# Patient Record
Sex: Female | Born: 1990 | Race: White | Hispanic: No | Marital: Single | Smoking: Never smoker
Health system: Southern US, Community
[De-identification: ages and names within clinical notes are randomized; demographics above are authoritative.]

## PROBLEM LIST (undated history)

## (undated) ENCOUNTER — Encounter: Attending: Internal Medicine | Primary: Internal Medicine

## (undated) ENCOUNTER — Ambulatory Visit

## (undated) ENCOUNTER — Encounter

## (undated) ENCOUNTER — Telehealth

## (undated) ENCOUNTER — Ambulatory Visit: Attending: Pharmacist | Primary: Pharmacist

## (undated) ENCOUNTER — Ambulatory Visit: Payer: PRIVATE HEALTH INSURANCE

## (undated) ENCOUNTER — Encounter: Payer: PRIVATE HEALTH INSURANCE | Attending: Internal Medicine | Primary: Internal Medicine

## (undated) ENCOUNTER — Encounter: Payer: PRIVATE HEALTH INSURANCE | Attending: Surgery | Primary: Surgery

## (undated) ENCOUNTER — Encounter: Attending: Obstetrics & Gynecology | Primary: Obstetrics & Gynecology

## (undated) ENCOUNTER — Telehealth: Attending: Internal Medicine | Primary: Internal Medicine

## (undated) ENCOUNTER — Ambulatory Visit: Payer: BLUE CROSS/BLUE SHIELD

## (undated) ENCOUNTER — Telehealth
Attending: Student in an Organized Health Care Education/Training Program | Primary: Student in an Organized Health Care Education/Training Program

## (undated) ENCOUNTER — Encounter
Attending: Student in an Organized Health Care Education/Training Program | Primary: Student in an Organized Health Care Education/Training Program

## (undated) DIAGNOSIS — K5090 Crohn's disease, unspecified, without complications: Secondary | ICD-10-CM

## (undated) DIAGNOSIS — J029 Acute pharyngitis, unspecified: Principal | ICD-10-CM

## (undated) DIAGNOSIS — B2790 Infectious mononucleosis, unspecified without complication: Secondary | ICD-10-CM

## (undated) HISTORY — DX: Crohn's disease, unspecified, without complications: K50.90

## (undated) HISTORY — DX: Acute pharyngitis, unspecified: J02.9

## (undated) HISTORY — DX: Infectious mononucleosis, unspecified without complication: B27.90

---
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
## 2012-02-19 ENCOUNTER — Encounter: Payer: Self-pay | Admitting: Internal Medicine

## 2012-02-19 ENCOUNTER — Ambulatory Visit (INDEPENDENT_AMBULATORY_CARE_PROVIDER_SITE_OTHER): Payer: BC Managed Care – PPO | Admitting: Internal Medicine

## 2012-02-19 VITALS — BP 90/62 | HR 123 | Temp 99.9°F | Ht 63.0 in | Wt 114.1 lb

## 2012-02-19 DIAGNOSIS — L659 Nonscarring hair loss, unspecified: Secondary | ICD-10-CM | POA: Insufficient documentation

## 2012-02-19 DIAGNOSIS — J3501 Chronic tonsillitis: Secondary | ICD-10-CM

## 2012-02-19 DIAGNOSIS — J029 Acute pharyngitis, unspecified: Secondary | ICD-10-CM | POA: Insufficient documentation

## 2012-02-19 DIAGNOSIS — B2790 Infectious mononucleosis, unspecified without complication: Secondary | ICD-10-CM

## 2012-02-19 DIAGNOSIS — K5090 Crohn's disease, unspecified, without complications: Secondary | ICD-10-CM

## 2012-02-19 HISTORY — DX: Infectious mononucleosis, unspecified without complication: B27.90

## 2012-02-19 HISTORY — DX: Acute pharyngitis, unspecified: J02.9

## 2012-02-19 MED ORDER — TRAMADOL HCL 50 MG PO TABS: 50.0000 mg | ORAL_TABLET | Freq: Four times a day (QID) | ORAL | Status: AC | PRN

## 2012-02-19 MED ORDER — NAPROXEN 500 MG PO TABS: 500.0000 mg | ORAL_TABLET | Freq: Two times a day (BID) | ORAL | Status: AC

## 2012-02-19 MED ORDER — AZITHROMYCIN 250 MG PO TABS: ORAL_TABLET | ORAL | Status: AC

## 2012-02-19 NOTE — Patient Instructions (Addendum)
Take all new medications as prescribed Continue all other medications as before You will be contacted regarding the referral for: [facility]

## 2012-02-20 ENCOUNTER — Encounter: Payer: Self-pay | Admitting: Internal Medicine

## 2012-02-20 NOTE — Assessment & Plan Note (Signed)
Mild to mod, for antibx course,  to f/u any worsening symptoms or concerns 

## 2012-02-20 NOTE — Progress Notes (Signed)
  Subjective:    Patient ID: Angela Little, female    DOB: 26-Jun-1991, 21 y.o.   MRN: [id number]  HPI  Here as new pt, mother present and helps with hx, pt tearful, c/o worsening pain again to the pharynx, bilat upper neck, and ears, pt and mother quite frustrated over apparently 4th episode infectious symtpoms.  Pt has hx of mono in past, none recent.  Has seen UC in Nov 2012 with antibx, then 2 episodes tx at [facility], and also incidentally seen her GI Dr Jacqulyn Bath at [facility] for crohn f/u thought to be doing well.  Worst part this time is a very large tonsil on the right which makes it more difficult to breath and swallow pills, new and persistent in past few months, worse today. Past Medical History  Diagnosis Date  . Crohn's disease   . Alopecia   . Mononucleosis 02/19/2012    History 2012  . Pharyngitis 02/19/2012   History reviewed. No pertinent past surgical history.  reports that she has never smoked. She does not have any smokeless tobacco history on file. She reports that she does not drink alcohol or use illicit drugs. family history includes Arthritis in her other; Cancer in her other; Hyperlipidemia in her other; and Hypertension in her other. No Known Allergies No current outpatient prescriptions on file prior to visit.   Review of Systems Review of Systems  Constitutional: Negative for diaphoresis and unexpected weight change.  HENT: Negative for drooling and tinnitus.   Eyes: Negative for photophobia and visual disturbance.  Respiratory: Negative for choking and stridor.   Gastrointestinal: Negative for vomiting and blood in stool.  Genitourinary: Negative for hematuria and decreased urine volume.       Objective:   Physical Exam Physical Exam  VS noted, mild ill Constitutional: Pt appears well-developed and well-nourished.  HENT: Head: Normocephalic.  Right Ear: External ear normal.  Left Ear: External ear normal.  Bilat tm's mild erythema.  Sinus nontender.  Pharynx  severe erythema, right tonsil 2-3+ enlarged with crypts, no drainage Eyes: Conjunctivae and EOM are normal. Pupils are equal, round, and reactive to light.  Neck: Normal range of motion. Neck supple. but with marked bilat submandib LA, tender, nonfluctuant Cardiovascular: Normal rate and regular rhythm.   Pulmonary/Chest: Effort normal and breath sounds normal.  Abd:  Soft, NT, non-distended, + BS Neurological: Pt is alert. No cranial nerve deficit.  Skin: Skin is warm. No erythema.  Psychiatric: Pt behavior is normal. Thought content normal. 1+ tearful    Assessment & Plan:

## 2012-02-20 NOTE — Assessment & Plan Note (Signed)
With large chronic appaering right tonsil, worse today, with some difficulty with sob/swallowing but not acute obstructing today, and not abscessed - for antix as above, but will need ENT, and d/w mother and pt  - prefer [facility] ENT as pt plans to be there for the next 2 mo to finish the semester

## 2014-10-19 NOTE — Unmapped (Signed)
1430 Pt in for infusion. Pt's first time in [facility] for first time of Entyvio. Pt call light with in reach. Pt teaching regarding S\S of adverse reaction and how to use call light to summon staff if experiencing any S\S of adverse reaction while infusing. Pt verbalizing understanding.   1440 Pre meds given.   1500 Entyvio 300mg  in 266ml@560ml /hr  1515 Entyvio infusing. Pt C\O slight discomfort at IV site. Good blood return no infiltration noted. Entyvio slowed to 472ml/hr.   1536 Entyvio infusion complete. Pt teaching regarding S\S of adverse reaction and who to call if experiencing any S\S of adverse reaction upon DC from [facility]. Pt verbalizing understanding. Written instructions given to pt.   1555 VSS no S\S of adverse reaction.

## 2017-07-20 ENCOUNTER — Ambulatory Visit
Admission: RE | Admit: 2017-07-20 | Discharge: 2017-07-20 | Disposition: A | Discharge status: Home / Self Care | Payer: Commercial Managed Care - PPO

## 2017-07-20 DIAGNOSIS — K8301 Primary sclerosing cholangitis: Secondary | ICD-10-CM

## 2017-07-20 DIAGNOSIS — R945 Abnormal results of liver function studies: Secondary | ICD-10-CM

## 2017-07-20 DIAGNOSIS — K5010 Crohn's disease of large intestine without complications: Principal | ICD-10-CM

## 2017-07-22 MED ORDER — TOFACITINIB 10 MG TABLET
ORAL_TABLET | Freq: Two times a day (BID) | ORAL | 0 refills | 0 days | Status: CP
Start: 2017-07-22 — End: 2017-07-30

## 2017-07-30 MED ORDER — TOFACITINIB 10 MG TABLET
ORAL_TABLET | Freq: Two times a day (BID) | ORAL | 0 refills | 0 days | Status: CP
Start: 2017-07-30 — End: 2017-08-30

## 2017-08-14 ENCOUNTER — Ambulatory Visit
Admission: RE | Admit: 2017-08-14 | Discharge: 2017-08-14 | Disposition: A | Discharge status: Home / Self Care | Payer: Commercial Managed Care - PPO

## 2017-08-14 DIAGNOSIS — K8301 Primary sclerosing cholangitis: Principal | ICD-10-CM

## 2017-08-14 DIAGNOSIS — R945 Abnormal results of liver function studies: Secondary | ICD-10-CM

## 2017-08-30 MED ORDER — XELJANZ 10 MG TABLET
ORAL_TABLET | 0 refills | 0 days | Status: CP
Start: 2017-08-30 — End: 2017-09-26

## 2017-09-27 MED ORDER — XELJANZ 10 MG TABLET
ORAL_TABLET | 0 refills | 0 days | Status: CP
Start: 2017-09-27 — End: 2017-10-25

## 2017-10-25 MED ORDER — XELJANZ 10 MG TABLET
ORAL_TABLET | 0 refills | 0 days | Status: CP
Start: 2017-10-25 — End: 2017-11-24

## 2017-10-26 ENCOUNTER — Ambulatory Visit
Admission: RE | Admit: 2017-10-26 | Discharge: 2017-10-26 | Disposition: A | Discharge status: Home / Self Care | Payer: Commercial Managed Care - PPO | Admitting: Internal Medicine

## 2017-10-26 DIAGNOSIS — K5010 Crohn's disease of large intestine without complications: Principal | ICD-10-CM

## 2017-11-10 MED ORDER — ERGOCALCIFEROL (VITAMIN D2) 1,250 MCG (50,000 UNIT) CAPSULE
ORAL_CAPSULE | ORAL | 0 refills | 0.00000 days | Status: CP
Start: 2017-11-10 — End: 2018-01-11

## 2017-11-24 MED ORDER — XELJANZ 10 MG TABLET
ORAL_TABLET | 0 refills | 0 days | Status: CP
Start: 2017-11-24 — End: 2017-12-06

## 2017-12-06 MED ORDER — TOFACITINIB 5 MG TABLET
ORAL_TABLET | Freq: Two times a day (BID) | ORAL | 3 refills | 0 days | Status: CP
Start: 2017-12-06 — End: 2018-01-11

## 2018-01-11 ENCOUNTER — Encounter
Admit: 2018-01-11 | Discharge: 2018-01-12 | Payer: PRIVATE HEALTH INSURANCE | Attending: Internal Medicine | Primary: Internal Medicine

## 2018-01-11 DIAGNOSIS — K5100 Ulcerative (chronic) pancolitis without complications: Principal | ICD-10-CM

## 2018-01-11 DIAGNOSIS — R740 Nonspecific elevation of levels of transaminase and lactic acid dehydrogenase [LDH]: Secondary | ICD-10-CM

## 2018-01-11 MED ORDER — TOFACITINIB 5 MG TABLET
ORAL_TABLET | Freq: Two times a day (BID) | ORAL | 3 refills | 0 days | Status: CP
Start: 2018-01-11 — End: 2018-01-12

## 2018-01-11 MED ORDER — ERGOCALCIFEROL (VITAMIN D2) 1,250 MCG (50,000 UNIT) CAPSULE
ORAL_CAPSULE | ORAL | 0 refills | 0.00000 days | Status: CP
Start: 2018-01-11 — End: 2018-09-13

## 2018-01-12 MED ORDER — TOFACITINIB 5 MG TABLET
ORAL_TABLET | Freq: Two times a day (BID) | ORAL | 3 refills | 0.00000 days
Start: 2018-01-12 — End: 2018-05-16

## 2018-01-17 ENCOUNTER — Ambulatory Visit
Admit: 2018-01-17 | Discharge: 2018-01-18 | Payer: PRIVATE HEALTH INSURANCE | Attending: Internal Medicine | Primary: Internal Medicine

## 2018-01-17 DIAGNOSIS — R740 Nonspecific elevation of levels of transaminase and lactic acid dehydrogenase [LDH]: Secondary | ICD-10-CM

## 2018-01-17 DIAGNOSIS — R945 Abnormal results of liver function studies: Principal | ICD-10-CM

## 2018-01-24 ENCOUNTER — Ambulatory Visit: Admit: 2018-01-24 | Discharge: 2018-01-24 | Payer: PRIVATE HEALTH INSURANCE

## 2018-01-24 DIAGNOSIS — R945 Abnormal results of liver function studies: Principal | ICD-10-CM

## 2018-01-31 ENCOUNTER — Encounter
Admit: 2018-01-31 | Discharge: 2018-02-01 | Payer: PRIVATE HEALTH INSURANCE | Attending: Obstetrics & Gynecology | Primary: Obstetrics & Gynecology

## 2018-01-31 DIAGNOSIS — Z124 Encounter for screening for malignant neoplasm of cervix: Secondary | ICD-10-CM

## 2018-01-31 DIAGNOSIS — Z309 Encounter for contraceptive management, unspecified: Principal | ICD-10-CM

## 2018-01-31 MED ORDER — MICROGESTIN FE 1/20 (28) 1 MG-20 MCG (21)/75 MG (7) TABLET
ORAL_TABLET | Freq: Every day | ORAL | 4 refills | 0.00000 days | Status: CP
Start: 2018-01-31 — End: 2019-02-27

## 2018-04-29 ENCOUNTER — Encounter: Admit: 2018-04-29 | Discharge: 2018-04-30 | Payer: PRIVATE HEALTH INSURANCE

## 2018-04-29 DIAGNOSIS — Z23 Encounter for immunization: Principal | ICD-10-CM

## 2018-05-03 MED ORDER — XELJANZ 5 MG TABLET
ORAL_TABLET | 0 refills | 0 days | Status: CP
Start: 2018-05-03 — End: 2018-05-31

## 2018-05-17 MED ORDER — TOFACITINIB 5 MG TABLET
ORAL_TABLET | Freq: Two times a day (BID) | ORAL | 3 refills | 0 days
Start: 2018-05-17 — End: 2018-09-13

## 2018-05-31 MED ORDER — XELJANZ 5 MG TABLET
ORAL_TABLET | 3 refills | 0 days | Status: CP
Start: 2018-05-31 — End: 2018-09-13

## 2018-08-01 ENCOUNTER — Encounter: Admit: 2018-08-01 | Discharge: 2018-08-02 | Payer: PRIVATE HEALTH INSURANCE

## 2018-08-01 DIAGNOSIS — Z23 Encounter for immunization: Principal | ICD-10-CM

## 2018-09-13 ENCOUNTER — Encounter
Admit: 2018-09-13 | Discharge: 2018-09-14 | Payer: PRIVATE HEALTH INSURANCE | Attending: Internal Medicine | Primary: Internal Medicine

## 2018-09-13 DIAGNOSIS — K5100 Ulcerative (chronic) pancolitis without complications: Secondary | ICD-10-CM

## 2018-09-13 DIAGNOSIS — Z23 Encounter for immunization: Principal | ICD-10-CM

## 2018-09-13 MED ORDER — TOFACITINIB 5 MG TABLET
ORAL_TABLET | Freq: Two times a day (BID) | ORAL | 3 refills | 0.00000 days | Status: CP
Start: 2018-09-13 — End: 2018-11-23

## 2018-11-23 MED ORDER — TOFACITINIB 5 MG TABLET
ORAL_TABLET | Freq: Two times a day (BID) | ORAL | 3 refills | 0 days | Status: CP
Start: 2018-11-23 — End: 2019-03-03

## 2019-02-24 DIAGNOSIS — Z309 Encounter for contraceptive management, unspecified: Principal | ICD-10-CM

## 2019-02-27 DIAGNOSIS — Z309 Encounter for contraceptive management, unspecified: Principal | ICD-10-CM

## 2019-02-27 MED ORDER — MICROGESTIN FE 1/20 (28) 1 MG-20 MCG (21)/75 MG (7) TABLET
ORAL_TABLET | Freq: Every day | ORAL | 4 refills | 0 days | Status: CP
Start: 2019-02-27 — End: ?

## 2019-03-03 DIAGNOSIS — K5100 Ulcerative (chronic) pancolitis without complications: Principal | ICD-10-CM

## 2019-03-03 MED ORDER — XELJANZ 5 MG TABLET
ORAL_TABLET | Freq: Two times a day (BID) | ORAL | 3 refills | 90 days | Status: CP
Start: 2019-03-03 — End: ?
  Filled 2019-03-21: qty 60, 30d supply, fill #0

## 2019-03-16 NOTE — Unmapped (Signed)
[facility] Specialty Medication Referral: No PA Required      Medication (Brand/Generic): [name]    Final Test Claim completed with resulted information below:    Patient ABLE to fill at [facility]:  [employer] [place]  Anticipated Copay: $0  Is anticipated copay with a copay card or grant? No, there is no need for grant or copay assistance.     Does patient's insurance plan only allow a 15 day supply for the first 6 fills in the Split Fill Program? No  If yes, inform patient they can request to dis-enroll from the [facility] by calling the patient help desk at N/A.      If the copay is under the $25 defined limit, per policy there will be no further investigation of need for financial assistance at this time unless patient requests. This referral has been communicated to the provider and handed off to the [facility] team for further processing and filling of prescribed medication.   ______________________________________________________________________  Please utilize this referral for viewing purposes as it will serve as the central location for all relevant documentation and updates.

## 2019-03-17 NOTE — Unmapped (Signed)
[facility]   Patient Onboarding/Medication Counseling    Helen Calhoun is a 28 y.o. 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  I am speaking to the patient.    Verified patient's date of birth / HIPAA.    Specialty medication(s) to be sent: Inflammatory Disorders: [name]      Non-specialty medications/supplies to be sent: none       Xeljanz (tofacitinib)    Medication & Administration     Dosage: PSC - IBD (maintenance treatment): Take 5mg  by mouth twice daily    Lab tests required prior to treatment initiation:  ? Tuberculosis: Tuberculosis screening not documented in the patient's chart but medication prescriber has been notified of this missing documentation and wishes to continue treatment at this time.  ? Hepatitis B: Hepatitis B serology studies are complete and non-reactive.      Administration:  Take tablets by mouth with or without food.  For extended release tablets ??? swallow intact, do not crush, split, or chew.    Adherence/Missed dose instructions:  Take a missed dose as soon as you remember it if it's been less than 6 hours (IR tabs) or 12 hours (XR tabs) from your normal dosing time.  Never take 2 doses to try and catch up from a missed dose.    Goals of Therapy     ? Achieve remission of symptoms  ? Maintain remission of symptoms  ? Minimize long-term systemic glucocorticoid use  ? Prevent need for surgical procedures  ? Maintenance of effective psychosocial functioning      Side Effects & Monitoring Parameters     ? Signs of a common cold ??? minor sore throat, runny or stuffy nose, etc.  ? Headache  ? Diarrhea    The following side effects should be reported to the provider:  ? Signs of a hypersensitivity reaction ??? rash; hives; itching; red, swollen, blistered, or peeling skin; wheezing; tightness in the chest or throat; difficulty breathing, swallowing, or talking; swelling of the mouth, face, lips, tongue, or throat; etc.  ? Reduced immune function ??? report signs of infection such as fever; chills; body aches; very bad sore throat; ear or sinus pain; cough; more sputum or change in color of sputum; pain with passing urine; wound that will not heal, etc.  Also at a slightly higher risk of some malignancies (mainly skin and blood cancers) due to this reduced immune function.  o In the case of signs of infection ??? the patient should hold the next dose of Xeljanz?? and call your primary care provider to ensure adequate medical care.  Treatment may be resumed when infection is treated and patient is asymptomatic.  ? Changes in skin ??? a new growth or lump that forms; changes in shape, size, or color of a previous mole or marking  ? Swelling or pain in your stomach that is very bad, gets worse, or does not go away  ? Any signs of GI bleed ??? blood in vomit or stool  ? Heartbeat that does not feel normal  ? Shortness of breath      Contraindications, Warnings, & Precautions     ? Dosage adjustment may be needed if used concomitantly with other CYP3A4 substrates  ? Have your bloodwork checked as you have been told by your prescriber  ? Talk with your doctor if you are pregnant, planning to become pregnant, or breastfeeding  ? Discuss the possible need for holding your  dose(s) of Humira?? when a planned procedure is scheduled with the prescriber as it may delay healing/recovery timeline   ?     Drug/Food Interactions     ? Medication list reviewed in Epic. The patient was instructed to inform the care team before taking any new medications or supplements. No drug interactions identified.   ? This medication can interact with grapefruit, star fruit, and [place] oranges  ? Talk with you prescriber or pharmacist before receiving any live vaccinations while taking this medication and after you stop taking it      Storage, Handling Precautions, & Disposal     ? Store in original container  ? Store at room temperature  ? Avoid moisture    ** Due to having been on this medication for ~2 years, the patient declined the following portions of the counseling outlined above: administration; adherence / missed dose instructions; goals of therapy; side effects and monitoring parameters; contraindications, warnings, and precautions; drug/food interactions; storage, handling precautions, and disposal **    Current Medications (including OTC/herbals), Comorbidities and Allergies     Current Outpatient Medications   Medication Sig Dispense Refill   ??? loratadine (CLARITIN) 10 mg tablet Take 10 mg by mouth daily as needed.      ??? MICROGESTIN FE 1/20, 28, 1 mg-20 mcg (21)/75 mg (7) per tablet Take 1 tablet by mouth daily. 84 tablet 4   ??? tofacitinib (XELJANZ) 5 mg Tab tablet Take 1 tablet (5 mg total) by mouth Two (2) times a day. 180 tablet 3     No current facility-administered medications for this visit.        No Known Allergies    Patient Active Problem List   Diagnosis   ??? Crohn's colitis (CMS-HCC)   ??? Alopecia areata   ??? Chronic rhinitis   ??? Acute tonsillitis   ??? Encounter for gynecological examination (general) (routine) without abnormal findings   ??? Atypical squamous cells of undetermined significance on cytologic smear of cervix (ASC-US)       Reviewed and up to date in Epic.    Appropriateness of Therapy     Is medication and dose appropriate based on diagnosis? Yes    Baseline Quality of Life Assessment      How many days over the past month did your IBD keep you from your normal activities? 0    Financial Information     Medication Assistance provided: None Required    Anticipated copay of $0.00 reviewed with patient. Verified delivery address.    Delivery Information     Scheduled delivery date: 03/22/2019    Expected start date: n/a -  Pt is currently taking this medication    Medication will be delivered via Next Day Courier to the prescription address in [facility].  This shipment will not require a signature.      Explained the services we provide at [facility] and that each month we would call to set up refills.  Stressed importance of returning phone calls so that we could ensure they receive their medications in time each month.  Informed patient that we should be setting up refills 7-10 days prior to when they will run out of medication.  A pharmacist will reach out to perform a clinical assessment periodically.  Informed patient that a welcome packet and a drug information handout will be sent.      Patient verbalized understanding of the above information as well as how to contact  the pharmacy at [phone] option 4 with any questions/concerns.  The pharmacy is open Monday through Friday 8:30am-4:30pm.  A pharmacist is available 24/7 via pager to answer any clinical questions they may have.    Patient Specific Needs     ? Does the patient have any physical, cognitive, or cultural barriers? No    ? Patient prefers to have medications discussed with  Patient     ? Is the patient able to read and understand education materials at a high school level or above? Yes    ? Patient's primary language is  English     ? Is the patient high risk? No     ? Does the patient require a Care Management Plan? No     ? Does the patient require physician intervention or other additional services (i.e. nutrition, smoking cessation, social work)? No      Karene Fry Jakory Matsuo  [facility] [employer] [facility] Specialty Pharmacist

## 2019-03-21 MED FILL — XELJANZ 5 MG TABLET: 30 days supply | Qty: 60 | Fill #0 | Status: AC

## 2019-04-13 NOTE — Unmapped (Signed)
[facility] Clinical Assessment & Refill Coordination Note    Helen Calhoun, DOB: 12/08/1990  Phone: [phone] (home)     All above HIPAA information was verified with patient.     Specialty Medication(s):   Inflammatory Disorders: [name]     Current Outpatient Medications   Medication Sig Dispense Refill   ??? loratadine (CLARITIN) 10 mg tablet Take 10 mg by mouth daily as needed.      ??? MICROGESTIN FE 1/20, 28, 1 mg-20 mcg (21)/75 mg (7) per tablet Take 1 tablet by mouth daily. 84 tablet 4   ??? tofacitinib (XELJANZ) 5 mg Tab tablet Take 1 tablet (5 mg total) by mouth Two (2) times a day. 180 tablet 3     No current facility-administered medications for this visit.         Changes to medications: Helen Calhoun reports no changes at this time.    No Known Allergies    Changes to allergies: No    SPECIALTY MEDICATION ADHERENCE     Xeljanz 5 mg: 8 days of medicine on hand       Medication Adherence    Patient reported X missed doses in the last month:  0  Specialty Medication:  [name] 5mg           Specialty medication(s) dose(s) confirmed: Regimen is correct and unchanged.     Are there any concerns with adherence? No    Adherence counseling provided? Not needed    CLINICAL MANAGEMENT AND INTERVENTION      Clinical Benefit Assessment:    Do you feel the medicine is effective or helping your condition? Yes    Clinical Benefit counseling provided? Not needed    Adverse Effects Assessment:    Are you experiencing any side effects? No    Are you experiencing difficulty administering your medicine? No    Quality of Life Assessment:    How many days over the past month did your IBD keep you from your normal activities? For example, brushing your teeth or getting up in the morning. Patient declined to answer    Have you discussed this with your provider? Not needed    Therapy Appropriateness:    Is therapy appropriate? Yes, therapy is appropriate and should be continued DISEASE/MEDICATION-SPECIFIC INFORMATION      N/A    PATIENT SPECIFIC NEEDS     ? Does the patient have any physical, cognitive, or cultural barriers? No    ? Is the patient high risk? No     ? Does the patient require a Care Management Plan? No     ? Does the patient require physician intervention or other additional services (i.e. nutrition, smoking cessation, social work)? No      SHIPPING     Specialty Medication(s) to be Shipped:   Inflammatory Disorders: [name] 5mg     Other medication(s) to be shipped: none       Changes to insurance: No    Delivery Scheduled: Yes, Expected medication delivery date: 04/18/2019.     Medication will be delivered via Next Day Courier to the confirmed prescription address in [facility].    The patient will receive a drug information handout for each medication shipped and additional FDA Medication Guides as required.  Verified that patient has previously received a Conservation officer, historic buildings.    Karene Fry Kevon Tench   [facility] [employer] [facility] Specialty Pharmacist

## 2019-04-17 MED FILL — XELJANZ 5 MG TABLET: ORAL | 30 days supply | Qty: 60 | Fill #1

## 2019-04-17 MED FILL — XELJANZ 5 MG TABLET: 30 days supply | Qty: 60 | Fill #1 | Status: AC

## 2019-05-12 NOTE — Unmapped (Signed)
[facility] Refill Coordination Note    Specialty Medication(s) to be Shipped:   Inflammatory Disorders: Helen Calhoun    Other medication(s) to be shipped: N/A     Helen Calhoun, DOB: 1991-02-10  Phone: There are no phone numbers on file.      All above HIPAA information was verified with patient.     Completed refill call assessment today to schedule patient's medication shipment from the [facility] [phone]).       Specialty medication(s) and dose(s) confirmed: Regimen is correct and unchanged.   Changes to medications: [name] reports no changes at this time.  Changes to insurance: No  Questions for the pharmacist: No    Confirmed patient received Welcome Packet with first shipment. The patient will receive a drug information handout for each medication shipped and additional FDA Medication Guides as required.       DISEASE/MEDICATION-SPECIFIC INFORMATION        N/A    SPECIALTY MEDICATION ADHERENCE     Medication Adherence    Patient reported X missed doses in the last month:  0  Specialty Medication:  Helen Calhoun 5mg         XELJANZ 5 mg: 14 days of medicine on hand       SHIPPING     Shipping address confirmed in Epic.     Delivery Scheduled: Yes, Expected medication delivery date: 05/23/19.     Medication will be delivered via Next Day Courier to the prescription address in Epic [facility].    Helen Calhoun [facility] Specialty Pharmacist

## 2019-05-12 NOTE — Unmapped (Signed)
The [facility] has made a third and final attempt to reach this patient to refill the following medication:xeljanz.      We have left voicemails on the following phone numbers: [phone].    Dates contacted: 05/28,06/01,06/05  Last scheduled delivery: 05/11    The patient may be at risk of non-compliance with this medication. The patient should call the [facility] at [phone] (option 4) to refill medication.    Antonietta Barcelona   [facility] Specialty Technician

## 2019-05-22 MED FILL — XELJANZ 5 MG TABLET: ORAL | 30 days supply | Qty: 60 | Fill #2

## 2019-05-22 MED FILL — XELJANZ 5 MG TABLET: 30 days supply | Qty: 60 | Fill #2 | Status: AC

## 2019-06-13 NOTE — Unmapped (Signed)
[facility] Refill Coordination Note    Specialty Medication(s) to be Shipped:   General Specialty: [name] 5mg     Other medication(s) to be shipped:       Helen Calhoun, DOB: 1991/02/10  Phone: There are no phone numbers on file.      All above HIPAA information was verified with patient.     Completed refill call assessment today to schedule patient's medication shipment from the [facility] [phone]).       Specialty medication(s) and dose(s) confirmed: Regimen is correct and unchanged.   Changes to medications: Helen Calhoun reports no changes at this time.  Changes to insurance: No  Questions for the pharmacist: No    Confirmed patient received Welcome Packet with first shipment. The patient will receive a drug information handout for each medication shipped and additional FDA Medication Guides as required.       DISEASE/MEDICATION-SPECIFIC INFORMATION        N/A    SPECIALTY MEDICATION ADHERENCE     Medication Adherence    Patient reported X missed doses in the last month:  1  Specialty Medication:  [name] 5mg   Patient is on additional specialty medications:  No                Xeljanz 5 mg: 14 days of medicine on hand       SHIPPING     Shipping address confirmed in Epic.     Delivery Scheduled: Yes, Expected medication delivery date: 07/16.     Medication will be delivered via Next Day Courier to the prescription address in Epic W[facility].    Antonietta Barcelona   [facility] Specialty Technician

## 2019-06-21 MED FILL — XELJANZ 5 MG TABLET: 30 days supply | Qty: 60 | Fill #3 | Status: AC

## 2019-06-21 MED FILL — XELJANZ 5 MG TABLET: ORAL | 30 days supply | Qty: 60 | Fill #3

## 2019-07-11 NOTE — Unmapped (Signed)
[facility] Pharm[facility] Refill Coordination Note    Specialty Medication(s) to be Shipped:   General Specialty: [name] 5mg     Other medication(s) to be shipped:       Helen Calhoun, DOB: [birth year]  Phone: There are no phone numbers on file.      All above HIPAA information was verified with patient.     Completed refill call assessment today to schedule patient's medication shipment from the [facility] [phone]).       Specialty medication(s) and dose(s) confirmed: Regimen is correct and unchanged.   Changes to medications: Helen Calhoun reports no changes at this time.  Changes to insurance: No  Questions for the pharmacist: No    Confirmed patient received Welcome Packet with first shipment. The patient will receive a drug information handout for each medication shipped and additional FDA Medication Guides as required.       DISEASE/MEDICATION-SPECIFIC INFORMATION        N/A    SPECIALTY MEDICATION ADHERENCE     Medication Adherence    Patient reported X missed doses in the last month: 1  Specialty Medication: xeljanz 5mg   Patient is on additional specialty medications: No                Xeljanz 5 mg: 14 days of medicine on hand     SHIPPING     Shipping address confirmed in Epic.     Delivery Scheduled: Yes, Expected medication delivery date: 08/13.     Medication will be delivered via Next Day Courier to the prescription address in Epic WAM.    Helen Calhoun   [facility] Specialty Technician

## 2019-07-19 MED FILL — XELJANZ 5 MG TABLET: ORAL | 30 days supply | Qty: 60 | Fill #4

## 2019-07-19 MED FILL — XELJANZ 5 MG TABLET: 30 days supply | Qty: 60 | Fill #4 | Status: AC

## 2019-08-09 NOTE — Unmapped (Signed)
[facility] Refill Coordination Note    Specialty Medication(s) to be Shipped:   General Specialty: Helen Calhoun 5mg     Other medication(s) to be shipped:       Helen Calhoun, DOB: [birth year]  Phone: There are no phone numbers on file.      All above HIPAA information was verified with patient.     Completed refill call assessment today to schedule patient's medication shipment from the [facility] [phone]).       Specialty medication(s) and dose(s) confirmed: Regimen is correct and unchanged.   Changes to medications: Helen Calhoun reports no changes at this time.  Changes to insurance: No  Questions for the pharmacist: No    Confirmed patient received Welcome Packet with first shipment. The patient will receive a drug information handout for each medication shipped and additional FDA Medication Guides as required.       DISEASE/MEDICATION-SPECIFIC INFORMATION        N/A    SPECIALTY MEDICATION ADHERENCE     Medication Adherence    Patient reported X missed doses in the last month: 2  Specialty Medication: Helen Calhoun 5mg   Patient is on additional specialty medications: No                Xeljanz 5 mg: 14 days of medicine on hand       SHIPPING     Shipping address confirmed in Epic.     Delivery Scheduled: Yes, Expected medication delivery date: 09/10.     Medication will be delivered via Next Day Courier to the prescription address in Epic WAM.    Antonietta Barcelona   [facility] Specialty Technician

## 2019-08-16 MED FILL — XELJANZ 5 MG TABLET: ORAL | 30 days supply | Qty: 60 | Fill #5

## 2019-08-16 MED FILL — XELJANZ 5 MG TABLET: 30 days supply | Qty: 60 | Fill #5 | Status: AC

## 2019-09-05 NOTE — Unmapped (Signed)
[facility] Clinical Assessment & Refill Coordination Note    Helen Calhoun, DOB: 1991-09-30  Phone: There are no phone numbers on file.    All above HIPAA information was verified with patient.     Specialty Medication(s):   Inflammatory Disorders: [name]     Current Outpatient Medications   Medication Sig Dispense Refill   ??? loratadine (CLARITIN) 10 mg tablet Take 10 mg by mouth daily as needed.      ??? MICROGESTIN FE 1/20, 28, 1 mg-20 mcg (21)/75 mg (7) per tablet Take 1 tablet by mouth daily. 84 tablet 4   ??? tofacitinib (XELJANZ) 5 mg Tab tablet Take 1 tablet (5 mg total) by mouth Two (2) times a day. 180 tablet 3     No current facility-administered medications for this visit.         Changes to medications: [name] reports no changes at this time.    No Known Allergies    Changes to allergies: No    SPECIALTY MEDICATION ADHERENCE   Xeljanz 5 mg: 0 days of medicine on hand       Medication Adherence    Specialty Medication: [name] 5mg    Patient is on additional specialty medications: No  Informant: patient          Specialty medication(s) dose(s) confirmed: Regimen is correct and unchanged.     Are there any concerns with adherence? No    Adherence counseling provided? Not needed    CLINICAL MANAGEMENT AND INTERVENTION      Clinical Benefit Assessment:    Do you feel the medicine is effective or helping your condition? Yes    Clinical Benefit counseling provided? Not needed    Adverse Effects Assessment:    Are you experiencing any side effects? No    Are you experiencing difficulty administering your medicine? No    Quality of Life Assessment:    How many days over the past month did your Ulcerative pancolitis  keep you from your normal activities? For example, brushing your teeth or getting up in the morning. 0    Have you discussed this with your provider? Not needed    Therapy Appropriateness:    Is therapy appropriate? Yes, therapy is appropriate and should be continued    DISEASE/MEDICATION-SPECIFIC INFORMATION      N/A    PATIENT SPECIFIC NEEDS     ? Does the patient have any physical, cognitive, or cultural barriers? No    ? Is the patient high risk? No     ? Does the patient require a Care Management Plan? No     ? Does the patient require physician intervention or other additional services (i.e. nutrition, smoking cessation, social work)? No      SHIPPING     Specialty Medication(s) to be Shipped:   Inflammatory Disorders: [name]    Other medication(s) to be shipped: n/a     Changes to insurance: No    Delivery Scheduled: Yes, Expected medication delivery date: 10/7.     Medication will be delivered via UPS to the confirmed home address in [place].    The patient will receive a drug information handout for each medication shipped and additional FDA Medication Guides as required.  Verified that patient has previously received a Conservation officer, historic buildings.    All of the patient's questions and concerns have been addressed.    [name]   [facility] Specialty Pharmacist

## 2019-09-12 MED FILL — XELJANZ 5 MG TABLET: ORAL | 30 days supply | Qty: 60 | Fill #6

## 2019-09-12 MED FILL — XELJANZ 5 MG TABLET: 30 days supply | Qty: 60 | Fill #6 | Status: AC

## 2019-10-02 NOTE — Unmapped (Signed)
[facility] Special[facility] Pharm[facility] Refill Coordination Note    Specialty Medication(s) to be Shipped:   General Specialty: Jorje Guild medication(s) to be shipped:       Helen Calhoun, DOB: Feb 11, 1991  Phone: There are no phone numbers on file.      All above HIPAA information was verified with patient.     Completed refill call assessment today to schedule patient's medication shipment from the [facility] [phone]).       Specialty medication(s) and dose(s) confirmed: Regimen is correct and unchanged.   Changes to medications: Helen Calhoun reports no changes at this time.  Changes to insurance: No  Questions for the pharmacist: No    Confirmed patient received Welcome Packet with first shipment. The patient will receive a drug information handout for each medication shipped and additional FDA Medication Guides as required.       DISEASE/MEDICATION-SPECIFIC INFORMATION        N/A    SPECIALTY MEDICATION ADHERENCE     Medication Adherence    Patient reported X missed doses in the last month: 2  Specialty Medication: xeljanz 5mg   Patient is on additional specialty medications: No  Informant: patient                [name] 5mg  : 10 days of medicine on hand         SHIPPING     Shipping address confirmed in Epic.     Delivery Scheduled: Yes, Expected medication delivery date: 11/03.     Medication will be delivered via UPS to the home address in [place].    Antonietta Barcelona   [facility] Specialty Technician

## 2019-10-09 MED FILL — XELJANZ 5 MG TABLET: 30 days supply | Qty: 60 | Fill #7 | Status: AC

## 2019-10-09 MED FILL — XELJANZ 5 MG TABLET: ORAL | 30 days supply | Qty: 60 | Fill #7

## 2019-11-01 NOTE — Unmapped (Signed)
The [facility] has made a third and final attempt to reach this patient to refill the following medication:Xeljanz.      We have left voicemails on the following phone numbers: [phone].    Dates contacted: 11/19,11/23,11/25  Last scheduled delivery: 11/02    The patient may be at risk of non-compliance with this medication. The patient should call the [facility] at [phone] (option 4) to refill medication.    Antonietta Barcelona   [facility] Specialty Technician

## 2019-11-21 NOTE — Unmapped (Signed)
[facility] Pharm[facility] Refill Coordination Note    Specialty Medication(s) to be Shipped:   General Specialty: [name] 5    Other medication(s) to be shipped:       Helen Calhoun, DOB: [birth year]  Phone: There are no phone numbers on file.      All above HIPAA information was verified with patient.     Was a Nurse, learning disability used for this call? No    Completed refill call assessment today to schedule patient's medication shipment from the [facility] [phone]).       Specialty medication(s) and dose(s) confirmed: Regimen is correct and unchanged.   Changes to medications: [name] reports no changes at this time.  Changes to insurance: No  Questions for the pharmacist: No    Confirmed patient received Welcome Packet with first shipment. The patient will receive a drug information handout for each medication shipped and additional FDA Medication Guides as required.       DISEASE/MEDICATION-SPECIFIC INFORMATION        N/A    SPECIALTY MEDICATION ADHERENCE     Medication Adherence    Patient reported X missed doses in the last month: 0  Specialty Medication: xeljanz 5mg   Patient is on additional specialty medications: No  Informant: patient                xeljanz 5 mg: 14 days of medicine on hand         SHIPPING     Shipping address confirmed in Epic.     Delivery Scheduled: Yes, Expected medication delivery date: 12/17.     Medication will be delivered via UPS to the prescription address in [place].    Antonietta Barcelona   [facility] Specialty Technician

## 2019-11-22 MED FILL — XELJANZ 5 MG TABLET: 30 days supply | Qty: 60 | Fill #8 | Status: AC

## 2019-11-22 MED FILL — XELJANZ 5 MG TABLET: ORAL | 30 days supply | Qty: 60 | Fill #8

## 2019-12-12 ENCOUNTER — Other Ambulatory Visit: Payer: Self-pay

## 2019-12-12 NOTE — Unmapped (Signed)
Called to schedule Mychart request for Well-woman appointment and STD test. left message for pt to call and schedule and will msg bk via Mychart apt line

## 2019-12-13 NOTE — Unmapped (Signed)
[facility] Special[facility] Pharmacy Refill Coordination Note    Specialty Medication(s) to be Shipped:   General Specialty: [name] 5mg     Other medication(s) to be shipped:       Helen Calhoun, DOB: 08/04/1991  Phone: [phone] (home)       All above HIPAA information was verified with patient.     Was a Nurse, learning disability used for this call? No    Completed refill call assessment today to schedule patient's medication shipment from the [facility] [phone]).       Specialty medication(s) and dose(s) confirmed: Regimen is correct and unchanged.   Changes to medications: Helen Calhoun reports no changes at this time.  Changes to insurance: No  Questions for the pharmacist: No    Confirmed patient received Welcome Packet with first shipment. The patient will receive a drug information handout for each medication shipped and additional FDA Medication Guides as required.       DISEASE/MEDICATION-SPECIFIC INFORMATION        N/A    SPECIALTY MEDICATION ADHERENCE     Medication Adherence    Patient reported X missed doses in the last month: 1  Specialty Medication: xeljanz 5mg   Patient is on additional specialty medications: No  Informant: patient                xeljanz 5 mg: 14days of medicine on hand         SHIPPING     Shipping address confirmed in Epic.     Delivery Scheduled: Yes, Expected medication delivery date: 01/14.     Medication will be delivered via UPS to the prescription address in [place].    Helen Calhoun   [facility] Specialty Technician

## 2019-12-14 ENCOUNTER — Encounter: Payer: BC Managed Care – PPO | Admitting: Obstetrics and Gynecology

## 2019-12-20 MED FILL — XELJANZ 5 MG TABLET: 30 days supply | Qty: 60 | Fill #9 | Status: AC

## 2019-12-20 MED FILL — XELJANZ 5 MG TABLET: ORAL | 30 days supply | Qty: 60 | Fill #9

## 2019-12-26 ENCOUNTER — Encounter: Payer: BC Managed Care – PPO | Admitting: Obstetrics and Gynecology

## 2020-01-15 NOTE — Unmapped (Signed)
[facility] Clinical Assessment & Refill Coordination Note    Helen Calhoun, DOB: [birth year]  Phone: [phone] (home)     All above HIPAA information was verified with patient.     Was a Nurse, learning disability used for this call? No    Specialty Medication(s):   Inflammatory Disorders: [name]     Current Outpatient Medications   Medication Sig Dispense Refill   ??? loratadine (CLARITIN) 10 mg tablet Take 10 mg by mouth daily as needed.      ??? MICROGESTIN FE 1/20, 28, 1 mg-20 mcg (21)/75 mg (7) per tablet Take 1 tablet by mouth daily. 84 tablet 4   ??? tofacitinib (XELJANZ) 5 mg Tab tablet Take 1 tablet (5 mg total) by mouth Two (2) times a day. 180 tablet 3     No current facility-administered medications for this visit.         Changes to medications: [name] reports no changes at this time.    No Known Allergies    Changes to allergies: No    SPECIALTY MEDICATION ADHERENCE     Xeljanz 5 mg: 14 days of medicine on hand       Medication Adherence    Patient reported X missed doses in the last month: 0  Specialty Medication: [name] 5mg    Patient is on additional specialty medications: No  Informant: patient          Specialty medication(s) dose(s) confirmed: Regimen is correct and unchanged.     Are there any concerns with adherence? No    Adherence counseling provided? Not needed    CLINICAL MANAGEMENT AND INTERVENTION      Clinical Benefit Assessment:    Do you feel the medicine is effective or helping your condition? Yes    Clinical Benefit counseling provided? Not needed    Adverse Effects Assessment:    Are you experiencing any side effects? No    Are you experiencing difficulty administering your medicine? No    Quality of Life Assessment:    How many days over the past month did your Crohn;s  keep you from your normal activities? For example, brushing your teeth or getting up in the morning. 0    Have you discussed this with your provider? Not needed    Therapy Appropriateness: Is therapy appropriate? Yes, therapy is appropriate and should be continued    DISEASE/MEDICATION-SPECIFIC INFORMATION      N/A    PATIENT SPECIFIC NEEDS     ? Does the patient have any physical, cognitive, or cultural barriers? No    ? Is the patient high risk? No     ? Does the patient require a Care Management Plan? No     ? Does the patient require physician intervention or other additional services (i.e. nutrition, smoking cessation, social work)? No      SHIPPING     Specialty Medication(s) to be Shipped:   Inflammatory Disorders: [name]    Other medication(s) to be shipped: n/a     Changes to insurance: No    Delivery Scheduled: Yes, Expected medication delivery date: 2/11.     Medication will be delivered via UPS to the confirmed prescription address in [facility].    The patient will receive a drug information handout for each medication shipped and additional FDA Medication Guides as required.  Verified that patient has previously received a Conservation officer, historic buildings.    All of the patient's questions and concerns have been addressed.  [name]   [facility] Shared [facility] Specialty Pharmacist

## 2020-01-17 MED FILL — XELJANZ 5 MG TABLET: 30 days supply | Qty: 60 | Fill #10 | Status: AC

## 2020-01-17 MED FILL — XELJANZ 5 MG TABLET: ORAL | 30 days supply | Qty: 60 | Fill #10

## 2020-02-15 NOTE — Unmapped (Signed)
The [facility] has made a third and final attempt to reach this patient to refill the following medication:  Xeljanz 5mg .      We have left voicemails on the following phone numbers: [phone] (H) & [phone] (H).    Dates contacted: 03/04, 03/08 & 02/15/2020    Last scheduled delivery: 01/17/2020    The patient may be at risk of non-compliance with this medication. The patient should call the [facility] at [phone] (option 4) to refill medication.    Inioluwa Boulay Leodis Binet   [facility] Specialty Technician

## 2020-02-20 NOTE — Unmapped (Signed)
[facility] Special[facility] Pharmacy Refill Coordination Note    Specialty Medication(s) to be Shipped:   General Specialty: [name] 5mg     Other medication(s) to be shipped: N/A     [name], DOB: 07/16/1991  Phone: [phone] (home)       All above HIPAA information was verified with patient.     Was a Nurse, learning disability used for this call? No    Completed refill call assessment today to schedule patient's medication shipment from the [facility] [phone]).       Specialty medication(s) and dose(s) confirmed: Regimen is correct and unchanged.   Changes to medications: [name] reports no changes at this time.  Changes to insurance: No  Questions for the pharmacist: No    Confirmed patient received Welcome Packet with first shipment. The patient will receive a drug information handout for each medication shipped and additional FDA Medication Guides as required.       DISEASE/MEDICATION-SPECIFIC INFORMATION        N/A    SPECIALTY MEDICATION ADHERENCE     Medication Adherence    Patient reported X missed doses in the last month: 2  Specialty Medication: [name] 5mg   Patient is on additional specialty medications: No          Xeljanz 5 mg: 14 days of medicine on hand     SHIPPING     Shipping address confirmed in Epic.     Delivery Scheduled: Yes, Expected medication delivery date: 02/27/2020.     Medication will be delivered via UPS to the prescription address in Epic [facility].    Lorelei Pont [facility] Specialty Technician

## 2020-02-21 NOTE — Unmapped (Signed)
-----   Message from Louann Sjogren sent at 02/21/2020 11:24 AM EDT -----  Regarding: RE: follow up with Dr. Charolette Forward,  A vm and recall have been placed.    Appreciatively,  sheva  ----- Message -----  From: Dorisann Frames, RN  Sent: 02/21/2020  10:36 AM EDT  To: Monte Fantasia, MD, #  Subject: follow up with Dr. Jacob Moores morning team,  Could you kindly contact this patient to offer follow up visit with Dr. Jacqulyn Bath via telemed or in person?  Next available is [facility].  She needs to be seen for continued care and medications (please advise her).    Dr. Jacqulyn Bath ccd for FYI as the pharmacy has also noted many contact attempts to arrange medication delivery and has not heard back from patient.     Thank you  Inetta Fermo

## 2020-02-21 NOTE — Unmapped (Signed)
Message received from [facility] - MD notified, GI schedulers notified to contact patient for clinic visit with Dr. Jacqulyn Bath to review POC and compliance.    The [facility] has made a third and final attempt to reach this patient to refill the following medication:  Xeljanz 5mg .    ??  We have left voicemails on the following phone numbers: [phone] (H) & [phone] (H).  ??  Dates contacted: 03/04, 03/08 & 02/15/2020  ??  Last scheduled delivery: 01/17/2020  ??  The patient may be at risk of non-compliance with this medication. The patient should call the [facility] at [phone] (option 4) to refill medication.  ??  Bhakti Leodis Binet   [facility] Specialty Technician

## 2020-02-22 ENCOUNTER — Ambulatory Visit: Payer: BC Managed Care – PPO | Attending: Internal Medicine

## 2020-02-22 DIAGNOSIS — Z23 Encounter for immunization: Secondary | ICD-10-CM

## 2020-02-22 NOTE — Progress Notes (Signed)
   Covid-19 Vaccination Clinic  Name:  Angela Little    MRN: [id number] DOB: 03/20/1991  02/22/2020  [name] was observed post Covid-19 immunization for 15 minutes without incident. She was provided with Vaccine Information Sheet and instruction to access the V-Safe system.   Ms. Lines was instructed to call 911 with any severe reactions post vaccine: Marland Kitchen Difficulty breathing  . Swelling of face and throat  . A fast heartbeat  . A bad rash all over body  . Dizziness and weakness   Immunizations Administered    Name Date Dose VIS Date Route   Pfizer COVID-19 Vaccine 02/22/2020 12:34 PM 0.3 mL 11/17/2019 Intramuscular   Manufacturer: [employer], [employer]   Lot: [id number]   NDC: [address]-1

## 2020-02-26 MED FILL — XELJANZ 5 MG TABLET: ORAL | 30 days supply | Qty: 60 | Fill #11

## 2020-02-26 MED FILL — XELJANZ 5 MG TABLET: 30 days supply | Qty: 60 | Fill #11 | Status: AC

## 2020-03-18 ENCOUNTER — Ambulatory Visit: Payer: BC Managed Care – PPO | Attending: Internal Medicine

## 2020-03-18 DIAGNOSIS — Z23 Encounter for immunization: Secondary | ICD-10-CM

## 2020-03-18 NOTE — Progress Notes (Signed)
   Covid-19 Vaccination Clinic  Name:  Angela Little    MRN: [id number] DOB: [birth year]  03/18/2020  Ms. Streb was observed post Covid-19 immunization for 15 minutes without incident. She was provided with Vaccine Information Sheet and instruction to access the V-Safe system.   Ms. Mackel was instructed to call 911 with any severe reactions post vaccine: Marland Kitchen Difficulty breathing  . Swelling of face and throat  . A fast heartbeat  . A bad rash all over body  . Dizziness and weakness   Immunizations Administered    Name Date Dose VIS Date Route   Pfizer COVID-19 Vaccine 03/18/2020 11:19 AM 0.3 mL 11/17/2019 Intramuscular   Manufacturer: [employer], [employer]   Lot: [id number]   NDC: [address]-5

## 2020-03-20 DIAGNOSIS — K5100 Ulcerative (chronic) pancolitis without complications: Principal | ICD-10-CM

## 2020-03-21 NOTE — Unmapped (Signed)
Refill request for Helen Calhoun denied at this time, patient is overdue for safety monitoring labs and clinic visit with Dr. Jacqulyn Bath.  Contact attempts have been made to reach the patient to set these up, no response has been received.

## 2020-03-22 DIAGNOSIS — F329 Major depressive disorder, single episode, unspecified: Principal | ICD-10-CM

## 2020-03-25 ENCOUNTER — Encounter (HOSPITAL_COMMUNITY): Payer: Self-pay | Admitting: Psychiatry

## 2020-03-25 DIAGNOSIS — K5100 Ulcerative (chronic) pancolitis without complications: Principal | ICD-10-CM

## 2020-03-25 DIAGNOSIS — K5010 Crohn's disease of large intestine without complications: Principal | ICD-10-CM

## 2020-03-25 DIAGNOSIS — Z79899 Other long term (current) drug therapy: Principal | ICD-10-CM

## 2020-03-25 NOTE — Unmapped (Signed)
[facility]ty Pharmacy Refill Coordination Note    Specialty Medication(s) to be Shipped:   Inflammatory Disorders: [name] 5MG      Helen Calhoun, DOB: 1991-02-19  Phone: [phone] (home)     All above HIPAA information was verified with patient.     Was a Nurse, learning disability used for this call? No    Completed refill call assessment today to schedule patient's medication shipment from the [facility] [phone]).       Specialty medication(s) and dose(s) confirmed: Regimen is correct and unchanged.   Changes to medications: Helen Calhoun reports no changes at this time.  Changes to insurance: No  Questions for the pharmacist: No    Confirmed patient received Welcome Packet with first shipment. The patient will receive a drug information handout for each medication shipped and additional FDA Medication Guides as required.       DISEASE/MEDICATION-SPECIFIC INFORMATION        N/A    SPECIALTY MEDICATION ADHERENCE     Medication Adherence    Patient reported X missed doses in the last month: 0  Specialty Medication: [name] 5mg   Patient is on additional specialty medications: No  Informant: patient  Reliability of informant: reliable        Xeljanz 5 mg: 8 days of medicine on hand     SHIPPING     Shipping address confirmed in Epic.     Delivery Scheduled: Yes, Expected medication delivery date: 03/29/2020.     Medication will be delivered via UPS to the prescription address in Epic [facility].    Helen Calhoun

## 2020-03-25 NOTE — Unmapped (Signed)
Patient reached out for xeljanz refill and lab coordination for refill.    Will enter labs at [facility] per patient request. Patient advised to contact IBD RN once labs completed to ensure timely review and refill.    MD aware.

## 2020-03-26 ENCOUNTER — Other Ambulatory Visit: Payer: Self-pay

## 2020-03-26 ENCOUNTER — Telehealth (INDEPENDENT_AMBULATORY_CARE_PROVIDER_SITE_OTHER): Payer: BC Managed Care – PPO | Admitting: Psychiatry

## 2020-03-26 ENCOUNTER — Encounter (HOSPITAL_COMMUNITY): Payer: Self-pay | Admitting: Psychiatry

## 2020-03-26 DIAGNOSIS — F411 Generalized anxiety disorder: Secondary | ICD-10-CM | POA: Diagnosis not present

## 2020-03-26 MED ORDER — CLONAZEPAM 0.5 MG TABLET
ORAL | 0 days
Start: 2020-03-26 — End: 2020-04-25

## 2020-03-26 MED ORDER — FLUOXETINE 20 MG TABLET
ORAL | 0.00000 days
Start: 2020-03-26 — End: 2020-05-03

## 2020-03-26 MED ORDER — XELJANZ 5 MG TABLET
ORAL_TABLET | Freq: Two times a day (BID) | ORAL | 3 refills | 90.00000 days | Status: CP
Start: 2020-03-26 — End: ?
  Filled 2020-03-28: qty 60, 30d supply, fill #0

## 2020-03-26 MED ORDER — FLUOXETINE HCL 20 MG PO TABS: ORAL_TABLET | ORAL | 0 refills | Status: DC

## 2020-03-26 MED ORDER — CLONAZEPAM 0.5 MG PO TABS: 0.5000 mg | ORAL_TABLET | Freq: Two times a day (BID) | ORAL | 0 refills | Status: DC | PRN

## 2020-03-26 NOTE — Progress Notes (Signed)
Psychiatric Initial Adult Assessment   Patient Identification: Angela Little MRN:  [id number] Date of Evaluation:  03/26/2020 Referral Source: self Chief Complaint:   Chief Complaint    Establish Care; Anxiety     Interview was conducted using teleconferencing application and I verified that I was speaking with the correct person using two identifiers. I discussed the limitations of evaluation and management by telemedicine and  the availability of in person appointments. Patient expressed understanding and agreed to proceed.  Visit Diagnosis:    ICD-10-CM   1. GAD (generalized anxiety disorder)  F41.1     History of Present Illness:  Angela Little is a 29 yo single WF who comes to establish regular psychiatric care. She reports at least 2 year history of excessive worrying, tension, inability to relax which has eventually lead to poor appetite ("I feel I have a constant nod in my stomach") and difficulties with sleep. Anxiety has worsened over past two moths and she started to feel excessively tired and depressed. She denies having panic attacks. She cannot identify specific stressors which could have triggered her anxiety but medical problems which she has been dealing with since adolescence (Crohn's disease, alopecia) appear to play a significant part. She had been in counseling in the past and at this time is also arranging to see a therapist. She has no hx of severe depression, mania, psychosis, suicidal thinking. She has no hx of inpatient psychiatric admissions. There is no hx of alcohol or drug abuse. She uses CBD oil to help with anxiety.  Medical history has been reviewed. Family hx only significant for paternal cousin with alcohol use disorder. 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 She enjoys her job and denies it is a source of stress for her. AT this time she is living with parents in  [place]. She has a group of supportive friends.  Associated Signs/Symptoms: Depression Symptoms:  depressed mood, anxiety, loss of energy/fatigue, disturbed sleep, decreased appetite, (Hypo) Manic Symptoms:  NOne Anxiety Symptoms:  Excessive Worry, Psychotic Symptoms:  None PTSD Symptoms: Negative  Past Psychiatric History: see above.  Previous Psychotropic Medications: No   Substance Abuse History in the last 12 months:  No.  Consequences of Substance Abuse: NA  Past Medical History:  Past Medical History:  Diagnosis Date  . Alopecia   . Crohn's disease (HCC)   . Mononucleosis 02/19/2012   History 2012  . Pharyngitis 02/19/2012   History reviewed. No pertinent surgical history.  Family Psychiatric History: Reviewed.  Family History:  Family History  Problem Relation Age of Onset  . Arthritis Other   . Cancer Other        colon cancer  . Hyperlipidemia Other   . Hypertension Other   . Alcohol abuse Cousin     Social History:   Social History   Socioeconomic History  . Marital status: Single    Spouse name: Not on file  . Number of children: Not on file  . Years of education: Not on file  . Highest education level: Not on file  Occupational History  . Occupation: Social worker  Tobacco Use  . Smoking status: Never Smoker  . Smokeless tobacco: Never Used  Substance and Sexual Activity  . Alcohol use: No  . Drug use: No  . Sexual activity: Not on file  Other Topics Concern  . Not on file  Social History Narrative  . Not  on file   Social Determinants of Health   Financial Resource Strain:   . Difficulty of Paying Living Expenses:   Food Insecurity:   . Worried About Programme researcher, broadcasting/film/video in the Last Year:   . Barista in the Last Year:   Transportation Needs:   . Freight forwarder (Medical):   Marland Kitchen Lack of Transportation (Non-Medical):   Physical Activity:   . Days of Exercise per Week:   . Minutes of Exercise per Session:    Stress:   . Feeling of Stress :   Social Connections:   . Frequency of Communication with Friends and Family:   . Frequency of Social Gatherings with Friends and Family:   . Attends Religious Services:   . Active Member of Clubs or Organizations:   . Attends Banker Meetings:   Marland Kitchen Marital Status:     Allergies:  No Known Allergies  Metabolic Disorder Labs: No results found for: HGBA1C, MPG No results found for: PROLACTIN No results found for: CHOL, TRIG, HDL, CHOLHDL, VLDL, LDLCALC No results found for: TSH  Therapeutic Level Labs: No results found for: LITHIUM No results found for: CBMZ No results found for: VALPROATE  Current Medications: Current Outpatient Medications  Medication Sig Dispense Refill  . Adalimumab (HUMIRA PEN [place]) Inject into the skin once a week.    . clonazePAM (KLONOPIN) 0.5 MG tablet Take 1 tablet (0.5 mg total) by mouth 2 (two) times daily as needed for anxiety (sleep). 30 tablet 0  . FLUoxetine (PROZAC) 20 MG tablet Take 0.5 tablets (10 mg total) by mouth daily for 8 days, THEN 1 tablet (20 mg total) daily. 34 tablet 0  . METHOTREXATE, ANTI-RHEUMATIC, PO Take by mouth once a week.     No current facility-administered medications for this visit.   1` Psychiatric Specialty Exam: Review of Systems  Constitutional: Positive for appetite change and fatigue.  Psychiatric/Behavioral: Positive for sleep disturbance. The patient is nervous/anxious.   All other systems reviewed and are negative.   There were no vitals taken for this visit.There is no height or weight on file to calculate BMI.  General Appearance: Casual and Well Groomed  Eye Contact:  Good  Speech:  Clear and Coherent and Normal Rate  Volume:  Normal  Mood:  Anxious and Depressed  Affect:  Tearful  Thought Process:  Goal Directed and Linear  Orientation:  Full (Time, Place, and Person)  Thought Content:  Rumination  Suicidal Thoughts:  No  Homicidal Thoughts:  No   Memory:  Immediate;   Good Recent;   Good Remote;   Good  Judgement:  Good  Insight:  Fair  Psychomotor Activity:  Normal  Concentration:  Concentration: Good  Recall:  Good  Fund of Knowledge:Good  Language: Good  Akathisia:  Negative  Handed:  Right  AIMS (if indicated):  not done  Assets:  Communication Skills Desire for Improvement Financial Resources/Insurance Housing Resilience Social Support Talents/Skills Vocational/Educational  ADL's:  Intact  Cognition: WNL  Sleep:  Fair    Assessment and Plan: 29 yo single WF who comes to establish regular psychiatric care. She reports at least 2 year history of excessive worrying, tension, inability to relax which has eventually lead to poor appetite ("I feel I have a constant nod in my stomach") and difficulties with sleep. Anxiety has worsened over past two moths and she started to feel excessively tired and depressed. She denies having panic attacks. She cannot identify specific stressors  which could have triggered her anxiety but medical problems which she has been dealing with since adolescence (Crohn's disease, alopecia) appear to play a significant part. She had been in counseling in the past and at this time is also arranging to see a therapist. She has no hx of severe depression, mania, psychosis, suicidal thinking. She has no hx of inpatient psychiatric admissions. There is no hx of alcohol or drug abuse. She uses CBD oil to help with anxiety.  Dx: Generalized anxiety disorder  Plan: After discussing various medication options we decided on a trial of fluoxetine: she will start at 10 mg x 8 days then increase dose to 20 mg daily. Clonazepam 0.5 mg bid will be added on as needed basis for anxiety/insomnia. She will hopefully start counseling soon. Next appointment in 3 weeks. The plan was discussed with patient who had an opportunity to ask questions and these were all answered. I spend 60 minutes in videoconferencing with the  patient and devoted approximately 50% of this time to explanation of diagnosis, discussion of treatment options and med education.   Stephanie Acre, MD 4/20/20218:56 AM

## 2020-03-28 LAB — CBC W/ DIFFERENTIAL
BANDED NEUTROPHILS ABSOLUTE COUNT: 0 10*3/uL (ref 0.0–0.1)
BASOPHILS ABSOLUTE COUNT: 0 10*3/uL (ref 0.0–0.2)
BASOPHILS RELATIVE PERCENT: 1 %
EOSINOPHILS ABSOLUTE COUNT: 0.1 10*3/uL (ref 0.0–0.4)
HEMATOCRIT: 38.5 % (ref 34.0–46.6)
HEMOGLOBIN: 12.8 g/dL (ref 11.1–15.9)
IMMATURE GRANULOCYTES: 0 %
LYMPHOCYTES RELATIVE PERCENT: 27 %
MEAN CORPUSCULAR HEMOGLOBIN CONC: 33.2 g/dL (ref 31.5–35.7)
MEAN CORPUSCULAR HEMOGLOBIN: 31.8 pg (ref 26.6–33.0)
MEAN CORPUSCULAR VOLUME: 96 fL (ref 79–97)
MONOCYTES ABSOLUTE COUNT: 0.4 10*3/uL (ref 0.1–0.9)
MONOCYTES RELATIVE PERCENT: 6 %
NEUTROPHILS ABSOLUTE COUNT: 4.1 10*3/uL (ref 1.4–7.0)
NEUTROPHILS RELATIVE PERCENT: 65 %
PLATELET COUNT: 354 10*3/uL (ref 150–450)
RED BLOOD CELL COUNT: 4.03 x10E6/uL (ref 3.77–5.28)
RED CELL DISTRIBUTION WIDTH: 12.3 % (ref 11.7–15.4)
WHITE BLOOD CELL COUNT: 6.4 10*3/uL (ref 3.4–10.8)

## 2020-03-28 LAB — COMPREHENSIVE METABOLIC PANEL
A/G RATIO: 1.5 (ref 1.2–2.2)
ALBUMIN: 4.9 g/dL (ref 3.9–5.0)
ALKALINE PHOSPHATASE: 105 IU/L (ref 39–117)
ALT (SGPT): 31 IU/L (ref 0–32)
AST (SGOT): 33 IU/L (ref 0–40)
BILIRUBIN TOTAL: 0.2 mg/dL (ref 0.0–1.2)
BLOOD UREA NITROGEN: 9 mg/dL (ref 6–20)
BUN / CREAT RATIO: 12 (ref 9–23)
CALCIUM: 10.1 mg/dL (ref 8.7–10.2)
CREATININE: 0.76 mg/dL (ref 0.57–1.00)
GFR MDRD AF AMER: 125 mL/min/{1.73_m2}
GFR MDRD NON AF AMER: 109 mL/min/{1.73_m2}
GLUCOSE: 78 mg/dL (ref 65–99)
POTASSIUM: 4.6 mmol/L (ref 3.5–5.2)
SODIUM: 142 mmol/L (ref 134–144)
TOTAL PROTEIN: 8.1 g/dL (ref 6.0–8.5)

## 2020-03-28 LAB — C-REACTIVE PROTEIN: C reactive protein:MCnc:Pt:Ser/Plas:Qn:: 1

## 2020-03-28 LAB — BLOOD UREA NITROGEN: Urea nitrogen:MCnc:Pt:Ser/Plas:Qn:: 9

## 2020-03-28 LAB — BANDED NEUTROPHILS ABSOLUTE COUNT: Granulocytes.immature:NCnc:Pt:Bld:Qn:Automated count: 0

## 2020-03-28 MED FILL — XELJANZ 5 MG TABLET: 30 days supply | Qty: 60 | Fill #0 | Status: AC

## 2020-04-01 DIAGNOSIS — K5100 Ulcerative (chronic) pancolitis without complications: Principal | ICD-10-CM

## 2020-04-01 MED ORDER — XELJANZ 5 MG TABLET
ORAL_TABLET | Freq: Two times a day (BID) | ORAL | 3 refills | 90.00000 days | Status: CP
Start: 2020-04-01 — End: ?

## 2020-04-01 NOTE — Unmapped (Signed)
Labs now up to date. xeljanz refill authorized

## 2020-04-16 ENCOUNTER — Ambulatory Visit
Admit: 2020-04-16 | Discharge: 2020-04-17 | Payer: PRIVATE HEALTH INSURANCE | Attending: Internal Medicine | Primary: Internal Medicine

## 2020-04-16 DIAGNOSIS — K8301 Primary sclerosing cholangitis: Principal | ICD-10-CM

## 2020-04-16 DIAGNOSIS — K5010 Crohn's disease of large intestine without complications: Principal | ICD-10-CM

## 2020-04-16 NOTE — Unmapped (Addendum)
[facility]  INFLAMMATORY BOWEL DISEASES CENTER        PATIENT PROFILE:    PSC/IBD         CHIEF COMPLAINT: F/u PSC-IBD, on Xeljanj    HISTORY OF PRESENT ILLNESS: [name] with a past medical history significant for alopecia universalis and Crohn's (PSC-IBD) colitis who presents in follow-up.  She has previously failed multiple therapies including:  Remicade, humira, cimzia, mtx, 6mp (including combination therapy), stelara ([place] dosing off label, no prior infusion), vedolizumab therapy (partial response) and now on tofacitinib maintenance at 5 mg PO  BID. Patient also with MRI/MRCP which showed stricturing and beading of the ducts consistent with PSC, liver biopsy also consistent with PSC, without any evidence of autoimmune overlap or DILI.  Last seen in clinic 09/2018.    Patient reports that she has been doing very well.  She still has 2-3 bowel movements in the morning.  She denies any blood in her stool.  Importantly, she no longer has urgency.  Takes Mauri [place] 5mg  twice daily. Doesn't notice when misses a day, which is rare.    Had some anxiety earlier this year.  Some related around medical results.  Had decreased appetite with the anxiety, now improved after speaking with psychiatry and starting Prozac. Weight loss ~10 lbs but at college weight. Happy with weight now. Living with parents in [place] currently but hoping to travel now that vaccinated and able to work remotely.    Is looking forward to establishing with GI more regularly.       PROBLEMS:  Patient Active Problem List   Diagnosis   ??? Crohn's colitis (CMS-HCC)   ??? Alopecia areata   ??? Chronic rhinitis   ??? Acute tonsillitis   ??? Encounter for gynecological examination (general) (routine) without abnormal findings   ??? Atypical squamous cells of undetermined significance on cytologic smear of cervix (ASC-US)         PAST MEDICAL HISTORY:    Past Medical History:   Diagnosis Date   ??? Abnormal LFTs    ??? Alopecia areata totalis    ??? Conjunctivitis    ??? Crohn's disease (CMS-HCC)    ??? HPV (human papilloma virus) infection     has had the HPV vaccine but had an abnormal pap and colpo that was negative.    ??? PSC (primary sclerosing cholangitis)    ??? Sinusitis    ??? Streptococcal sore throat        INFLAMMATORY BOWEL DISEASE COURSE/PHENOTYPE:    Crohn's disease, originally diagnosed in 2008 with ileocolonic inflammatory involvement, biopsies demonstrated chronic active colitis that was patchy in involvement. Most recent colonoscopy was in 2015, which demonstrated moderate to severe activity in the right colon, with inflammation in the distal terminal ileum.  Prior treatment therapies have included Remicade, Humira, cimzia, methotrexate, 6-MP, stelara and then maintained on vedolizumab monotherapy. MRI/MRCP 2017 with diagnosis of PSC. Colon 08/2016 with pancolitis, path with diffuse mod-severe chronic active colitis. 2018: start of tofacitinib, with clinical response, tapered off of vedolizumab, now on maintenance 5 mg PO BID. due for repeat colonoscopy.      ALLERGIES:    Patient has no known allergies.    SOCIAL HISTORY: Working in [place],  Forensic scientist.    Social History     Socioeconomic History   ??? Marital status: Single     Spouse name: None   ??? Number of children: None   ??? Years of education:  None   ??? Highest education level: None   Occupational History   ??? None   Tobacco Use   ??? Smoking status: Never Smoker   ??? Smokeless tobacco: Never Used   Substance and Sexual Activity   ??? Alcohol use: Yes     Alcohol/week: 0.0 standard drinks     Comment: 4 drinks/week    ??? Drug use: No   ??? Sexual activity: None   Other Topics Concern   ??? 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 Difficulty of Paying Living Expenses:    Food Insecurity:    ??? Worried About Programme researcher, broadcasting/film/video in the Last Year:    ??? Barista in the Last Year:    Transportation Needs:    ??? Freight forwarder (Medical):    ??? Lack of Transportation (Non-Medical):    Physical Activity:    ??? Days of Exercise per Week:    ??? Minutes of Exercise per Session:    Stress:    ??? Feeling of Stress :    Social Connections:    ??? Frequency of Communication with Friends and Family:    ??? Frequency of Social Gatherings with Friends and Family:    ??? Attends Religious Services:    ??? Database administrator or Organizations:    ??? Attends Engineer, structural:    ??? Marital Status:        FAMILY HISTORY:    family history includes Breast cancer (age of onset: 1) in her maternal grandmother; Multiple sclerosis in her maternal uncle.      REVIEW OF SYSTEMS:     The balance of 12 systems reviewed is negative except as noted in the HPI.         VITAL SIGNS:    BP 133/81  - Pulse 75  - Temp 36.7 ??C  - Resp 18  - Wt 52.7 kg (116 lb 3.2 oz)  - BMI 20.58 kg/m??     PHYSICAL EXAM:    Normal comprehensive exam:     Constitutional:   Alert, oriented x 3, no acute distress, well nourished, and well hydrated.   Mental Status:   Thought organized, appropriate affect, pleasantly interactive, tearful at times.   HEENT:   PERRL, conjunctiva clear, anicteric, oropharynx clear, neck supple, no LAD. Eyelashes now grown back.   Respiratory: Clear to auscultation, unlabored breathing.     Cardiac: Euvolemic, regular rate and rhythm, normal S1 and S2.     Abdomen: Soft, normal bowel sounds, non-distended, non tender, no organomegaly or masses.     Perianal/Rectal Exam Not performed.     Extremities:   No edema, well perfused.   Musculoskeletal: No joint tenderness or swelling.     Skin: Evidence of small regrowth of hair on arms, no rashes     Neuro: No focal deficits.           ASSESSMENT:        29 year old white female with PSC-IBD, PSC, psoriasis, and alopecia currently maintained on tofacitinib. She is currently in clinical remission.  Her extraintestinal manifestations have resolved.  We discussed the importance of repeat colonoscopy  for surveillance due to her PSC IBD.  Additionally, she recently had labs yesterday, which were reviewed.  She has had market improvement in her abnormal LFTs.  Also, her inflammatory markers have now normalized.  We discussed prevention.  I also discussed with her recent data and RA that showed an increased risk of clotting associated with higher dose long-term Xeljanj use in individuals with cardiovascular risk factors.  We discussed that this likely does not apply to her, as she does not have those risk factors, and is now on the low-dose maintenance.  However, we did discuss risk factors for clotting.       PLAN:          1.  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 twice daily.  I have refilled this medication.  She will need to continue with every 3 month laboratory evaluation.  Labs done recently are appropriate.  With her next labs, I will add vitamin D level as well as vitamin B12 level and iron profile given her mild fatigue.  2.  I emphasized the need for repeat colonoscopy for surveillance, given her increased risk of colorectal neoplasia associated with PSC IBD.  I have reordered colonoscopy.    3. PSC: Liver biopsy was consistent with PSC, also MRI/MRCP consistent with PSC.  LFTs have now normal. She no longer has itching.  We will continue to monitor and order repeat MRI/MRCP to assess for cirrhosis.    4.  Alopecia: She now has had some improvement with tofacitinib, this is also been reported in the literature indicates report format for alopecia that [name] can be effective.    5. Prevention: She is up-to-date on Prevnar 13 as well as PPSV23 vaccine.  Encouraged annual influenza vaccine. Got Pfizer COVID vaccine.  I emphasized the need for sunscreen use, as well as screening skin examinations.    6.  Follow-up in clinic in 6 months, sooner if needed based on symptoms.    Orion Crook. Adynn Caseres, MD MPH  Gastroenterology Fellow       I saw and evaluated the patient, participating in the key portions of the service.?? I reviewed the resident???s note.?? I agree with the resident???s findings and plan.      Millie D. Long MD, MPH  Associate Professor of Medicine  [employer] [facility] [employer] [facility] of Gastroenterology and Hepatology      DIAGNOSTIC STUDIES:  I have reviewed all pertinent diagnostic studies, including:    Liver biopsy:  01/2018  A: Liver, core biopsy   - Mild fibrosis portal expansion and positive copper staining in many periportal hepatocytes (see comment)  - Rare spotty lobular inflammation and rare portal and lobular ceroid laden macrophages    GI Procedures:  08/2016 Colonoscopy       - Preparation of the colon was fair. This was improved                        to adequate with lavage.                       - Abnormal perianal exam with anal canal tenderness.                       - Congested, erythematous, hemorrhagic, ulcerated and  vascular-pattern-decreased mucosa in the entire examined                        colon. Biopsied from the left and right colon for                        surveillance.  Pathology:  A: Colon, ascending, biopsy   - Diffuse moderate to severe chronic active colitis with reactive epithelial atypia, negative for dysplasia  - Ulceration with inflamed granulation tissue  - No CMV viral cytopathic effect or granulomas identified  ??  B: Colon, descending, biopsy   - Diffuse moderate to severe chronic active colitis, negative for dysplasia  - Ulceration with inflamed granulation tissue  - No CMV viral cytopathic effect or granulomas identified    08/2014 colonoscopy  Impression: - Congested, erythematous and ulcerated mucosa in the   entire examined colon with patchy distrubution.   Biopsied. Consistent with moderate activity of Crohn's   colitis.  - The examined portion of the ileum was normal.  - The distal rectum and anal verge are normal on   retroflexion view.    Pathology:  A: Colon, right, biopsy  - Moderate to severe chronic active colitis with crypt depletion and erosion,  negative for dysplasia  - No granulomas identified    B: Colon, left, biopsy   - Moderate chronic active colitis, negative for dysplasia  - No granulomas identified      2012 colonoscopy:  Impression: - Erythematous and granular mucosa in the rectum. This was biopsied. This was consistent with mild activity of Crohn's disease. - Erythematous, hemorrhagic, inflamed and ulcerated mucosa in the descending colon, in the transverse colon, in the ascending colon and in the cecum. This was biopsied. This was consistent with moderate to severe activity of Crohn's disease. - Congested mucosa in the terminal ileum for 5-10 cm, normal ileum above this level.     Pathology:  A: Small bowel, terminal ileum, biopsy - Severe chronic active enteritis, consistent with IBD, negative for dysplasia - Ulceration with inflamed granulation tissue - No CMV viral cytopathic effect identified B: Colon, right, biopsy - Moderate to severe chronic active colitis, negative for dysplasia - No CMV viral cytopathic effect identified. C: Colon, left, biopsy - Moderate to severe chronic active colitis, negative for dysplasia - No CMV viral cytopathic effect identified.       LIVER BIOPSY   A: Liver, core biopsy   - Chronic hepatitis, etiology undetermined, grade 1, stage 0 (see comment)     Comment:  The histologic features are those of a low grade chronic hepatitis process  without increased numbers of plasma cells. The differential diagnosis includes  viral and drug etiologies and autoimmune hepatitis is not favored.       Radiology results:    MRI/MRCP 2018  Impression     --Little interval change from 2017, including appearance of the biliary ducts suggestive of PSC.   --Patchy arterial enhancement, probably perfusional, less likely related to acute inflammatory process.         MRI/MRCP 2017    -- Mild multifocal beading / strictures of the bile ducts associated with slight multifocal dilatation in the intrahepatic bile ducts. These findings are suggestive of primary sclerosing cholangitis in this patient with history of Crohn's disease.  --Edema, thickening and enhancement of the transverse and descending colon wall associated with stranding, mesocolic engorgement and associated prominent lymph nodes. These findings are  suggestive of acute on chronic inflammation secondary to Crohn's colitis.  --Prominent mesocolic and omental and right lower quadrant lymph nodes. These are likely reactive and secondary to Crohn's disease.   --The gallbladder was hydropic at the time the exam. This could be due to long fasting. No evidence of acute cholecystitis or obstruction at the level of the hepatic ducts.      Laboratory results:    No visits with results within 1 Week(s) from this visit.   Latest known visit with results is:   Telephone on 03/25/2020   Component Date Value Ref Range Status   ??? Glucose 03/27/2020 78  65 - 99 mg/dL Final   ??? BUN 57/84/6962 9  6 - 20 mg/dL Final   ??? Creatinine 03/27/2020 0.76  0.57 - 1.00 mg/dL Final   ??? GFR MDRD Non Af Amer 03/27/2020 109  >59 mL/min/1.73 Final   ??? GFR MDRD Af Amer 03/27/2020 125  >59 mL/min/1.73 Final   ??? BUN/Creatinine Ratio 03/27/2020 12  9 - 23 Final   ??? Sodium 03/27/2020 142  134 - 144 mmol/L Final   ??? Potassium 03/27/2020 4.6  3.5 - 5.2 mmol/L Final   ??? Chloride 03/27/2020 104  96 - 106 mmol/L Final   ??? CO2 03/27/2020 19* 20 - 29 mmol/L Final   ??? Calcium 03/27/2020 10.1  8.7 - 10.2 mg/dL Final   ??? Total Protein 03/27/2020 8.1  6.0 - 8.5 g/dL Final   ??? Albumin 95/28/4132 4.9  3.9 - 5.0 g/dL Final   ??? Globulin, Total 03/27/2020 3.2  1.5 - 4.5 g/dL Final   ??? A/G Ratio 44/12/270 1.5  1.2 - 2.2 Final   ??? Total Bilirubin 03/27/2020 0.2  0.0 - 1.2 mg/dL Final   ??? Alkaline Phosphatase 03/27/2020 105  39 - 117 IU/L Final   ??? AST 03/27/2020 33  0 - 40 IU/L Final   ??? ALT 03/27/2020 31  0 - 32 IU/L Final   ??? WBC 03/27/2020 6.4  3.4 - 10.8 x10E3/uL Final   ??? RBC 03/27/2020 4.03  3.77 - 5.28 x10E6/uL Final   ??? HGB 03/27/2020 12.8  11.1 - 15.9 g/dL Final   ??? HCT 53/66/4403 38.5  34.0 - 46.6 % Final   ??? MCV 03/27/2020 96  79.0 - 97.0 fL Final   ??? MCH 03/27/2020 31.8  26.6 - 33.0 pg Final   ??? MCHC 03/27/2020 33.2  31.5 - 35.7 g/dL Final   ??? RDW 47/42/5956 12.3  11.7 - 15.4 % Final   ??? Platelet 03/27/2020 354  150 - 450 x10E3/uL Final   ??? Neutrophils % 03/27/2020 65  Not Estab. % Final   ??? Lymphocytes % 03/27/2020 27  Not Estab. % Final   ??? Monocytes % 03/27/2020 6  Not Estab. % Final   ??? Eosinophils % 03/27/2020 1  Not Estab. % Final   ??? Basophils % 03/27/2020 1  Not Estab. % Final   ??? Absolute Neutrophils 03/27/2020 4.1  1.4 - 7.0 x10E3/uL Final   ??? Absolute Lymphocytes 03/27/2020 1.8  0.7 - 3.1 x10E3/uL Final   ??? Absolute Monocytes  03/27/2020 0.4  0.1 - 0.9 x10E3/uL Final   ??? Absolute Eosinophils 03/27/2020 0.1  0.0 - 0.4 x10E3/uL Final   ??? Absolute Basophils  03/27/2020 0.0  0.0 - 0.2 x10E3/uL Final   ??? Immature Granulocytes 03/27/2020 0  Not Estab. % Final   ??? Bands Absolute 03/27/2020 0.0  0 - 0 x10E3/uL Final   ???  CRP 03/27/2020 1  0.0 - 10.0 mg/L Final

## 2020-04-16 NOTE — Unmapped (Addendum)
It was nice to meet you today.    Labs at [facility] July 21st [address]212.  Every 3 months.  Will schedule colonoscopy  Will schedule MRI/MRCP       Here are some contact information as well      Have you received a COVID-19 vaccine?     PREVENT COVID is a research study to help [place] learn more about how well the vaccine works in people with IBD.   To learn more, visit: [email or web address].org/preventcovid            Important Contact Numbers:    Fax number [phone]  Nurse number [phone]     GI Clinic Appointments:   [phone]  Please call the GI Clinic appointment line if you need to schedule, reschedule or cancel an appointment in clinic.  They can also answer any questions you may have about where your appointment is located and when you need to arrive.       GI Procedure Appointments: [phone]  Please call the GI Procedures line if you need to schedule, reschedule or cancel ANY type of procedure (EGD, colonoscopy, motility testing, etc).  You can also call this number for prep instructions, etc.       Radiology: [phone], option 3 or 4  If you are being scheduled for any type of radiology, you will need to call to receive your appointment time.  Please call this number for information.      For emergencies after normal business hours or on weekends/holidays   Proceed to the nearest emergency room OR contact the [facility] Operator at [phone] who can page the Gastroenterology Fellow on call.

## 2020-04-17 ENCOUNTER — Telehealth (INDEPENDENT_AMBULATORY_CARE_PROVIDER_SITE_OTHER): Payer: BC Managed Care – PPO | Admitting: Psychiatry

## 2020-04-17 ENCOUNTER — Other Ambulatory Visit: Payer: Self-pay

## 2020-04-17 DIAGNOSIS — F411 Generalized anxiety disorder: Secondary | ICD-10-CM | POA: Diagnosis not present

## 2020-04-17 MED ORDER — FLUOXETINE HCL 20 MG PO TABS: 20.0000 mg | ORAL_TABLET | Freq: Every day | ORAL | 0 refills | Status: DC

## 2020-04-17 NOTE — Progress Notes (Signed)
BH MD/PA/NP OP Progress Note  04/17/2020 9:24 AM Angela Little  MRN:  [id number] Interview was conducted using videoconferencing application and I verified that I was speaking with the correct person using two identifiers. I discussed the limitations of evaluation and management by telemedicine and  the availability of in person appointments. Patient expressed understanding and agreed to proceed.  Chief Complaint: Fatigue.  HPI:  29 yo single WF with generalized anxiety disorder. Over at least past 2 years she has been excessively worrying, feeling tension, inability to relax which has eventually lead to poor appetite ("I feel I have a constant nod in my stomach") and difficulties with sleep. Anxiety has worsened over past two moths and she started to feel excessively tired and depressed. She denies having panic attacks. She cannot identify specific stressors which could have triggered her anxiety but medical problems which she has been dealing with since adolescence (Crohn's disease, alopecia) appear to play a significant part although she is now in remission of symptoms (on Xeljenz). She had been in counseling in the past and at this time is also arranging to see a therapist. She has no hx of severe depression, mania, psychosis, suicidal thinking. We have started fluoxetine three weeks ago - she tolerates it well and reports less crying days", less anxiety already. I also added clonazepam prn anxiety which she has not used so far. Daytime fatigue, which predates use of fluoxetine, still persists.  Visit Diagnosis:    ICD-10-CM   1. GAD (generalized anxiety disorder)  F41.1     Past Psychiatric History: Please see intake H&P.  Past Medical History:  Past Medical History:  Diagnosis Date  . Alopecia   . Crohn's disease (HCC)   . Mononucleosis 02/19/2012   History 2012  . Pharyngitis 02/19/2012   No past surgical history on file.  Family Psychiatric History: Reviewed.  Family  History:  Family History  Problem Relation Age of Onset  . Arthritis Other   . Cancer Other        colon cancer  . Hyperlipidemia Other   . Hypertension Other   . Alcohol abuse Cousin     Social History:  Social History   Socioeconomic History  . Marital status: Single    Spouse name: Not on file  . Number of children: Not on file  . Years of education: Not on file  . Highest education level: Not on file  Occupational History  . Occupation: Social worker  Tobacco Use  . Smoking status: Never Smoker  . Smokeless tobacco: Never Used  Substance and Sexual Activity  . Alcohol use: No  . Drug use: No  . Sexual activity: Not on file  Other Topics Concern  . Not on file  Social History Narrative  . Not on file   Social Determinants of Health   Financial Resource Strain:   . Difficulty of Paying Living Expenses:   Food Insecurity:   . Worried About Programme researcher, broadcasting/film/video in the Last Year:   . Barista in the Last Year:   Transportation Needs:   . Freight forwarder (Medical):   Marland Kitchen Lack of Transportation (Non-Medical):   Physical Activity:   . Days of Exercise per Week:   . Minutes of Exercise per Session:   Stress:   . Feeling of Stress :   Social Connections:   . Frequency of Communication with Friends and Family:   . Frequency of Social Gatherings with Friends and Family:   .  Attends Religious Services:   . Active Member of Clubs or Organizations:   . Attends Archivist Meetings:   Marland Kitchen Marital Status:     Allergies: No Known Allergies  Metabolic Disorder Labs: No results found for: HGBA1C, MPG No results found for: PROLACTIN No results found for: CHOL, TRIG, HDL, CHOLHDL, VLDL, LDLCALC No results found for: TSH  Therapeutic Level Labs: No results found for: LITHIUM No results found for: VALPROATE No components found for:  CBMZ  Current Medications: Current Outpatient Medications  Medication Sig Dispense Refill  . Adalimumab  (HUMIRA PEN [place]) Inject into the skin once a week.    . clonazePAM (KLONOPIN) 0.5 MG tablet Take 1 tablet (0.5 mg total) by mouth 2 (two) times daily as needed for anxiety (sleep). 30 tablet 0  . [START ON 05/03/2020] FLUoxetine (PROZAC) 20 MG tablet Take 1 tablet (20 mg total) by mouth daily. 30 tablet 0  . METHOTREXATE, ANTI-RHEUMATIC, PO Take by mouth once a week.     No current facility-administered medications for this visit.     Psychiatric Specialty Exam: Review of Systems  Constitutional: Positive for fatigue.  Psychiatric/Behavioral: The patient is nervous/anxious.   All other systems reviewed and are negative.   There were no vitals taken for this visit.There is no height or weight on file to calculate BMI.  General Appearance: Casual and Well Groomed  Eye Contact:  Good  Speech:  Clear and Coherent and Normal Rate  Volume:  Normal  Mood:  Anxious  Affect:  Full Range  Thought Process:  Goal Directed and Linear  Orientation:  Full (Time, Place, and Person)  Thought Content: Logical   Suicidal Thoughts:  No  Homicidal Thoughts:  No  Memory:  Immediate;   Good Recent;   Good Remote;   Good  Judgement:  Good  Insight:  Good  Psychomotor Activity:  Normal  Concentration:  Concentration: Good  Recall:  Good  Fund of Knowledge: Good  Language: Good  Akathisia:  Negative  Handed:  Right  AIMS (if indicated): not done  Assets:  Communication Skills Desire for Improvement Financial Resources/Insurance Housing Social Support Talents/Skills Vocational/Educational  ADL's:  Intact  Cognition: WNL  Sleep:  Fair    Assessment and Plan: 29 yo single WF with generalized anxiety disorder. Over at least past 2 years she has been excessively worrying, feeling tension, inability to relax which has eventually lead to poor appetite ("I feel I have a constant nod in my stomach") and difficulties with sleep. Anxiety has worsened over past two moths and she started to feel  excessively tired and depressed. She denies having panic attacks. She cannot identify specific stressors which could have triggered her anxiety but medical problems which she has been dealing with since adolescence (Crohn's disease, alopecia) appear to play a significant part although she is now in remission of symptoms (on Xeljenz). She had been in counseling in the past and at this time is also arranging to see a therapist. She has no hx of severe depression, mania, psychosis, suicidal thinking. We have started fluoxetine three weeks ago - she tolerates it well and reports less crying days", less anxiety already. I also added clonazepam prn anxiety which she has not used so far. Daytime fatigue, which predates use of fluoxetine, still persists.  Dx: Generalized anxiety disorder  Plan: We will continue fluoxetine 20 mg daily and clonazepam 0.5 mg prn anxiety/insomnia. We may add bupropion later should fatigue do not subside. She will hopefully  start counseling soon. Next appointment in 6 weeks. The plan was discussed with patient who had an opportunity to ask questions and these were all answered. I spend 30 minutes in videoconferencing with the patient.    Magdalene Patricia, MD 04/17/2020, 9:24 AM

## 2020-04-22 DIAGNOSIS — K5100 Ulcerative (chronic) pancolitis without complications: Principal | ICD-10-CM

## 2020-04-22 MED ORDER — XELJANZ 5 MG TABLET
ORAL_TABLET | Freq: Two times a day (BID) | ORAL | 3 refills | 90.00000 days | Status: CP
Start: 2020-04-22 — End: ?
  Filled 2020-04-29: qty 60, 30d supply, fill #0

## 2020-04-22 NOTE — Unmapped (Signed)
[facility] Refill Coordination Note    Specialty Medication(s) to be Shipped:   Inflammatory Disorders: [name]    Other medication(s) to be shipped: n/a     Helen Calhoun, DOB: 03-08-1991  Phone: [phone] (home)       All above HIPAA information was verified with patient.     Was a Nurse, learning disability used for this call? No    Completed refill call assessment today to schedule patient's medication shipment from the [facility] [phone]).       Specialty medication(s) and dose(s) confirmed: Regimen is correct and unchanged.   Changes to medications: [name] reports no changes at this time.  Changes to insurance: No  Questions for the pharmacist: No    Confirmed patient received Welcome Packet with first shipment. The patient will receive a drug information handout for each medication shipped and additional FDA Medication Guides as required.       DISEASE/MEDICATION-SPECIFIC INFORMATION        N/A    SPECIALTY MEDICATION ADHERENCE     Medication Adherence    Patient reported X missed doses in the last month: 0  Specialty Medication: [name] 5mg   Patient is on additional specialty medications: No  Patient is on more than two specialty medications: No  Any gaps in refill history greater than 2 weeks in the last 3 months: no  Demonstrates understanding of importance of adherence: yes  Informant: patient  Reliability of informant: reliable                [name] 5mg : Patient has 10 days of medication on hand      SHIPPING     Shipping address confirmed in Epic.     Delivery Scheduled: Yes, Expected medication delivery date: 5/25.  However, Rx request for refills was sent to the provider as there are none remaining.     Medication will be delivered via UPS to the prescription address in Epic WAM.    Olga Millers   [facility] Specialty Technician

## 2020-04-29 MED FILL — XELJANZ 5 MG TABLET: 30 days supply | Qty: 60 | Fill #0 | Status: AC

## 2020-05-02 ENCOUNTER — Other Ambulatory Visit (HOSPITAL_COMMUNITY): Payer: Self-pay | Admitting: Psychiatry

## 2020-05-20 DIAGNOSIS — K5010 Crohn's disease of large intestine without complications: Principal | ICD-10-CM

## 2020-05-20 DIAGNOSIS — Z79899 Other long term (current) drug therapy: Principal | ICD-10-CM

## 2020-05-22 ENCOUNTER — Encounter
Admit: 2020-05-22 | Discharge: 2020-05-22 | Payer: PRIVATE HEALTH INSURANCE | Attending: Anesthesiology | Primary: Anesthesiology

## 2020-05-22 ENCOUNTER — Encounter: Admit: 2020-05-22 | Discharge: 2020-05-22 | Payer: PRIVATE HEALTH INSURANCE

## 2020-05-22 MED ADMIN — fentaNYL (PF) (SUBLIMAZE) injection: INTRAVENOUS | @ 18:00:00 | Stop: 2020-05-22

## 2020-05-22 MED ADMIN — ePHEDrine (PF) 25 mg/5 mL (5 mg/mL) in 0.9% sodium chloride syringe Syrg: INTRAVENOUS | @ 19:00:00 | Stop: 2020-05-22

## 2020-05-22 MED ADMIN — propofoL (DIPRIVAN) injection: INTRAVENOUS | @ 19:00:00 | Stop: 2020-05-22

## 2020-05-22 MED ADMIN — fentaNYL (PF) (SUBLIMAZE) injection: INTRAVENOUS | @ 19:00:00 | Stop: 2020-05-22

## 2020-05-22 MED ADMIN — lidocaine (XYLOCAINE) 20 mg/mL (2 %) injection: INTRAVENOUS | @ 18:00:00 | Stop: 2020-05-22

## 2020-05-22 MED ADMIN — propofol (DIPRIVAN) infusion 10 mg/mL: INTRAVENOUS | @ 19:00:00 | Stop: 2020-05-22

## 2020-05-22 MED ADMIN — propofoL (DIPRIVAN) injection: INTRAVENOUS | @ 18:00:00 | Stop: 2020-05-22

## 2020-05-22 MED ADMIN — lactated Ringers infusion: 10 mL/h | INTRAVENOUS | @ 18:00:00 | Stop: 2020-05-22

## 2020-05-31 ENCOUNTER — Other Ambulatory Visit: Payer: Self-pay

## 2020-05-31 ENCOUNTER — Telehealth (INDEPENDENT_AMBULATORY_CARE_PROVIDER_SITE_OTHER): Payer: BC Managed Care – PPO | Admitting: Psychiatry

## 2020-05-31 DIAGNOSIS — F411 Generalized anxiety disorder: Secondary | ICD-10-CM | POA: Diagnosis not present

## 2020-05-31 MED ORDER — CLONAZEPAM 0.5 MG PO TABS: 0.5000 mg | ORAL_TABLET | Freq: Two times a day (BID) | ORAL | 0 refills | Status: DC | PRN

## 2020-05-31 MED ORDER — FLUOXETINE HCL 20 MG PO TABS: 20.0000 mg | ORAL_TABLET | Freq: Every day | ORAL | 0 refills | Status: DC

## 2020-05-31 NOTE — Progress Notes (Addendum)
BH MD/PA/NP OP Progress Note  05/31/2020 9:14 AM Cami Delawder  MRN:  [id number] Interview was conducted using videoconferencing application and I verified that I was speaking with the correct person using two identifiers. I discussed the limitations of evaluation and management by telemedicine and  the availability of in person appointments. Patient expressed understanding and agreed to proceed. Patient location - home; physician - office.  Chief Complaint: "I am doing well and I now have a therapist".  HPI: 29 yo single WF with generalized anxiety disorder. Over at least past 2 years she has been excessively worrying, feeling tension, inability to relax which has eventually lead to poor appetite ("I feel I have a constant nod in my stomach") and difficulties with sleep. Anxiety has worsened over past two moths and she started to feel excessively tired and depressed. She denies having panic attacks. She cannot identify specific stressors which could have triggered her anxiety but medical problems which she has been dealing with since adolescence (Crohn's disease, alopecia) appear to play a significant part although she is now in remission of symptoms (on Xeljenz). She had been in counseling in the past and just started seeing new counselor three weeks ago. We have started fluoxetine 20 mg two months ago - she tolerates it well and reports less crying days", less anxiety. I also added clonazepam prn anxiety which she has not used so far. Daytime fatigue, which predates use of fluoxetine, still persists but is less bothersome.  Visit Diagnosis:    ICD-10-CM   1. GAD (generalized anxiety disorder)  F41.1     Past Psychiatric History: Please see intake H&P.  Past Medical History:  Past Medical History:  Diagnosis Date  . Alopecia   . Crohn's disease (HCC)   . Mononucleosis 02/19/2012   History 2012  . Pharyngitis 02/19/2012   No past surgical history on file.  Family Psychiatric  History: Reviewed.  Family History:  Family History  Problem Relation Age of Onset  . Arthritis Other   . Cancer Other        colon cancer  . Hyperlipidemia Other   . Hypertension Other   . Alcohol abuse Cousin     Social History:  Social History   Socioeconomic History  . Marital status: Single    Spouse name: Not on file  . Number of children: Not on file  . Years of education: Not on file  . Highest education level: Not on file  Occupational History  . Occupation: Social worker  Tobacco Use  . Smoking status: Never Smoker  . Smokeless tobacco: Never Used  Vaping Use  . Vaping Use: Never used  Substance and Sexual Activity  . Alcohol use: No  . Drug use: No  . Sexual activity: Not on file  Other Topics Concern  . Not on file  Social History Narrative  . Not on file   Social Determinants of Health   Financial Resource Strain:   . Difficulty of Paying Living Expenses:   Food Insecurity:   . Worried About Programme researcher, broadcasting/film/video in the Last Year:   . Barista in the Last Year:   Transportation Needs:   . Freight forwarder (Medical):   Marland Kitchen Lack of Transportation (Non-Medical):   Physical Activity:   . Days of Exercise per Week:   . Minutes of Exercise per Session:   Stress:   . Feeling of Stress :   Social Connections:   . Frequency of Communication  with Friends and Family:   . Frequency of Social Gatherings with Friends and Family:   . Attends Religious Services:   . Active Member of Clubs or Organizations:   . Attends Banker Meetings:   Marland Kitchen Marital Status:     Allergies: No Known Allergies  Metabolic Disorder Labs: No results found for: HGBA1C, MPG No results found for: PROLACTIN No results found for: CHOL, TRIG, HDL, CHOLHDL, VLDL, LDLCALC No results found for: TSH  Therapeutic Level Labs: No results found for: LITHIUM No results found for: VALPROATE No components found for:  CBMZ  Current Medications: Current  Outpatient Medications  Medication Sig Dispense Refill  . Adalimumab (HUMIRA PEN [place]) Inject into the skin once a week.    . clonazePAM (KLONOPIN) 0.5 MG tablet Take 1 tablet (0.5 mg total) by mouth 2 (two) times daily as needed for anxiety (sleep). 30 tablet 0  . FLUoxetine (PROZAC) 20 MG tablet Take 1 tablet (20 mg total) by mouth daily. 90 tablet 0  . METHOTREXATE, ANTI-RHEUMATIC, PO Take by mouth once a week.     No current facility-administered medications for this visit.     Psychiatric Specialty Exam: Review of Systems  Constitutional: Positive for fatigue.  Psychiatric/Behavioral: The patient is nervous/anxious.   All other systems reviewed and are negative.   There were no vitals taken for this visit.There is no height or weight on file to calculate BMI.  General Appearance: Casual and Well Groomed  Eye Contact:  Good  Speech:  Clear and Coherent and Normal Rate  Volume:  Normal  Mood:  Anxious  Affect:  Full Range  Thought Process:  Goal Directed and Linear  Orientation:  Full (Time, Place, and Person)  Thought Content: Logical   Suicidal Thoughts:  No  Homicidal Thoughts:  No  Memory:  Immediate;   Good Recent;   Good Remote;   Good  Judgement:  Good  Insight:  Good  Psychomotor Activity:  Normal  Concentration:  Concentration: Good  Recall:  Good  Fund of Knowledge: Good  Language: Good  Akathisia:  Negative  Handed:  Right  AIMS (if indicated): not done  Assets:  Communication Skills Desire for Improvement Financial Resources/Insurance Housing Resilience Social Support Talents/Skills Vocational/Educational  ADL's:  Intact  Cognition: WNL  Sleep:  Fair   Assessment and Plan: 29 yo single WF with generalized anxiety disorder. Over at least past 2 years she has been excessively worrying, feeling tension, inability to relax which has eventually lead to poor appetite ("I feel I have a constant nod in my stomach") and difficulties with sleep. Anxiety has  worsened over past two moths and she started to feel excessively tired and depressed. She denies having panic attacks. She cannot identify specific stressors which could have triggered her anxiety but medical problems which she has been dealing with since adolescence (Crohn's disease, alopecia) appear to play a significant part although she is now in remission of symptoms (on Xeljenz). She had been in counseling in the past and just started seeing new counselor three weeks ago. We have started fluoxetine two months ago - she tolerates it well and reports less anxiety. I also added clonazepam prn anxiety which she has not used so far. Daytime fatigue, which predates use of fluoxetine, still persists but is less bothersome.  Dx: Generalized anxiety disorder  Plan: We will continue fluoxetine 20 mg daily and clonazepam 0.5 mg prn anxiety/insomnia. She will continue in counseling and finds it very helpful. Next  appointment in 3 months.The plan was discussed with patient who had an opportunity to ask questions and these were all answered. I spend80minutes in videoconferencingwith the patient.   Stephanie Acre, MD 05/31/2020, 9:14 AM

## 2020-06-03 NOTE — Unmapped (Signed)
[facility] Refill Coordination Note    Patient is now aware that because she is a specialty patient and [name] is specialty medication that we have to speak to her each month and she can not call in her refill on automated system.     Specialty Medication(s) to be Shipped:   Inflammatory Disorders: [name]    Other medication(s) to be shipped: n/a     Helen Calhoun, DOB: 03/19/1991  Phone: [phone] (home)       All above HIPAA information was verified with patient.     Was a Nurse, learning disability used for this call? No    Completed refill call assessment today to schedule patient's medication shipment from the [facility] [phone]).       Specialty medication(s) and dose(s) confirmed: Regimen is correct and unchanged.   Changes to medications: Helen Calhoun reports no changes at this time.  Changes to insurance: No  Questions for the pharmacist: No    Confirmed patient received Welcome Packet with first shipment. The patient will receive a drug information handout for each medication shipped and additional FDA Medication Guides as required.       DISEASE/MEDICATION-SPECIFIC INFORMATION        N/A    SPECIALTY MEDICATION ADHERENCE     Medication Adherence    Patient reported X missed doses in the last month: 0  Specialty Medication: [name] 5mg   Patient is on additional specialty medications: No  Any gaps in refill history greater than 2 weeks in the last 3 months: no  Demonstrates understanding of importance of adherence: yes  Informant: patient  Reliability of informant: reliable  Confirmed plan for next specialty medication refill: delivery by pharmacy  Refills needed for supportive medications: not needed                Xeljanz 5mg : Patient has 7 days of medication on hand      SHIPPING     Shipping address confirmed in Epic.     Delivery Scheduled: Yes, Expected medication delivery date: 06/05/2020.     Medication will be delivered via UPS to the prescription address in Epic WAM.    Helen Calhoun   [facility] Specialty Technician

## 2020-06-04 ENCOUNTER — Encounter
Admit: 2020-06-04 | Discharge: 2020-06-05 | Payer: PRIVATE HEALTH INSURANCE | Attending: Internal Medicine | Primary: Internal Medicine

## 2020-06-04 MED FILL — XELJANZ 5 MG TABLET: 30 days supply | Qty: 60 | Fill #1 | Status: AC

## 2020-06-04 MED FILL — XELJANZ 5 MG TABLET: ORAL | 30 days supply | Qty: 60 | Fill #1

## 2020-06-04 NOTE — Unmapped (Signed)
Phone visit arranged today for discussion of pathology results.

## 2020-06-04 NOTE — Unmapped (Signed)
[facility] CONSULTATION VIS[facility]  INFLAMMATORY BOWEL DISEASES CENTER        PATIENT PROFILE:    PSC/IBD         CHIEF COMPLAINT: F/u Pathology results, PSC-IBD phone visit    HISTORY OF PRESENT ILLNESS: [name] with a past medical history significant for alopecia universalis and Crohn's (PSC-IBD) colitis. She has previously failed multiple therapies including:  Remicade, humira, cimzia, mtx, 6mp (including combination therapy), stelara ([place] dosing off label, no prior infusion), vedolizumab therapy (partial response) and now on tofacitinib maintenance at 5 mg PO  BID. Patient also with MRI/MRCP which showed stricturing and beading of the ducts consistent with PSC, liver biopsy also consistent with PSC, without any evidence of autoimmune overlap or DILI.    Interval history:  Patient underwent surveillance colonoscopy 2 weeks ago, which showed relatively mild inflammation, but evidence of multifocal high grade dysplasia. Risk factors include Helen Calhoun standing colitis and PSC. This was in multiple segments of the colon and represented flat dysplasia. Patient is still feeling well, with regular bowel movements, no blood and continued on Xeljanz.    We discussed the risk of colorectal cancer, and my recommendation for colectomy. We discussed J pouch and a staged reconstruction. We discussed that her liver has good synthetic function and she is an excellent candidate for surgery, particularly as she is not on steroids and her nutrition is excellent.    We discussed J pouch and that there can be a risk of pouchitis, but overall, we could potentially come off medication after surgery. Her mother asked about ileorectal anastomosis, which is a possibility, but continued very regular surveillance of the rectum would be indicated. I also recommended that she complete MRI prior to surgical constulation, for evaluation of her liver and this will also provide information about her bowel. PROBLEMS:  Patient Active Problem List   Diagnosis   ??? Crohn's colitis (CMS-HCC)   ??? Alopecia areata   ??? Chronic rhinitis   ??? Acute tonsillitis   ??? Encounter for gynecological examination (general) (routine) without abnormal findings   ??? Atypical squamous cells of undetermined significance on cytologic smear of cervix (ASC-US)         PAST MEDICAL HISTORY:    Past Medical History:   Diagnosis Date   ??? Abnormal LFTs    ??? Alopecia areata totalis    ??? Conjunctivitis    ??? Crohn's disease (CMS-HCC)    ??? HPV (human papilloma virus) infection     has had the HPV vaccine but had an abnormal pap and colpo that was negative.    ??? PSC (primary sclerosing cholangitis)    ??? Sinusitis    ??? Streptococcal sore throat        INFLAMMATORY BOWEL DISEASE COURSE/PHENOTYPE:    Crohn's disease, originally diagnosed in 2008 with ileocolonic inflammatory involvement, biopsies demonstrated chronic active colitis that was patchy in involvement. Most recent colonoscopy was in 2015, which demonstrated moderate to severe activity in the right colon, with inflammation in the distal terminal ileum.  Prior treatment therapies have included Remicade, Humira, cimzia, methotrexate, 6-MP, stelara and then maintained on vedolizumab monotherapy. MRI/MRCP 2017 with diagnosis of PSC. Colon 08/2016 with pancolitis, path with diffuse mod-severe chronic active colitis. 2018: start of tofacitinib, with clinical response, tapered off of vedolizumab, now on maintenance 5 mg PO BID. due for repeat colonoscopy.      PAST SURGICAL HISTORY:    Past Surgical History:   Procedure Laterality  Date   ??? PR COLONOSCOPY W/BIOPSY SINGLE/MULTIPLE  08/24/2014    Procedure: COLONOSCOPY, FLEXIBLE, PROXIMAL TO SPLENIC FLEXURE; WITH BIOPSY, SINGLE OR MULTIPLE;  Surgeon: Helen Pounds, MD;  Location: [facility];  Service: Gastroenterology   ??? PR COLONOSCOPY W/BIOPSY SINGLE/MULTIPLE N/A 08/28/2016    Procedure: COLONOSCOPY, FLEXIBLE, PROXIMAL TO SPLENIC FLEXURE; WITH BIOPSY, SINGLE OR MULTIPLE;  Surgeon: Helen Fantasia, MD;  Location: [facility];  Service: Gastroenterology   ??? PR COLONOSCOPY W/BIOPSY SINGLE/MULTIPLE N/A 05/22/2020    Procedure: COLONOSCOPY, FLEXIBLE, PROXIMAL TO SPLENIC FLEXURE; WITH BIOPSY, SINGLE OR MULTIPLE;  Surgeon: Helen Almond Jovonne Wilton, MD;  [facility]: [facility];  Service: Gastroenterology       MEDICATIONS:      Current Outpatient Medications:   ???  clonazePAM (KLONOPIN) 0.5 MG tablet, Take 0.5 mg by mouth., Disp: , Rfl:   ???  FLUoxetine (PROZAC) 20 MG tablet, Take by mouth., Disp: , Rfl:   ???  loratadine (CLARITIN) 10 mg tablet, Take 10 mg by mouth daily as needed.  (Patient not taking: Reported on 04/16/2020), Disp: , Rfl:   ???  MICROGESTIN FE 1/20, 28, 1 mg-20 mcg (21)/75 mg (7) per tablet, Take 1 tablet by mouth daily., Disp: 84 tablet, Rfl: 4  ???  tofacitinib (XELJANZ) 5 mg Tab tablet, Take 1 tablet (5 mg total) by mouth Two (2) times a day., Disp: 180 tablet, Rfl: 3  ???  tofacitinib (XELJANZ) 5 mg Tab tablet, Take 1 tablet (5 mg total) by mouth Two (2) times a day., Disp: 180 tablet, Rfl: 3      ALLERGIES:    Patient has no known allergies.    SOCIAL HISTORY: Working in [place],  Forensic scientist.    Social History     Socioeconomic History   ??? Marital status: Single     Spouse name: Not on file   ??? Number of children: Not on file   ??? Years of education: Not on file   ??? Highest education level: Not on file   Occupational History   ??? Not on file   Tobacco Use   ??? Smoking status: Never Smoker   ??? Smokeless tobacco: Never Used   Vaping Use   ??? Vaping Use: Never used   Substance and Sexual Activity   ??? Alcohol use: Yes     Alcohol/week: 0.0 standard drinks     Comment: 4 drinks/week    ??? Drug use: No   ??? Sexual activity: Not on file   Other Topics Concern   ??? 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 Difficulty of Paying Living Expenses:    Food Insecurity:    ??? Worried About Programme researcher, broadcasting/film/video in the Last Year:    ??? Barista in the Last Year:    Transportation Needs:    ??? Freight forwarder (Medical):    ??? Lack of Transportation (Non-Medical):    Physical Activity:    ??? Days of Exercise per Week:    ??? Minutes of Exercise per Session:    Stress:    ??? Feeling of Stress :  Social Connections:    ??? Frequency of Communication with Friends and Family:    ??? Frequency of Social Gatherings with Friends and Family:    ??? Attends Religious Services:    ??? Database administrator or Organizations:    ??? Attends Engineer, structural:    ??? Marital Status:        FAMILY HISTORY:    family history includes Breast cancer (age of onset: 75) in her maternal grandmother; Multiple sclerosis in her maternal uncle.      REVIEW OF SYSTEMS:     The balance of 12 systems reviewed is negative except as noted in the HPI.         VITAL SIGNS:    There were no vitals taken for this visit.    PHYSICAL EXAM:    No PE, phone visit       ASSESSMENT:        29 year old white female with PSC-IBD, PSC, psoriasis, and alopecia currently maintained on tofacitinib.  She is currently in clinical remission.  She has evidence of multifocal flat high and low grade dysplasia in multiple segments, this is not endoscopically resectable. I have recommended colectomy. I have recommended evaluation with Dr. Inetta Fermo at [facility].     PLAN:          1.  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 twice daily. Continue this therapy up until surgery, recommend MRI/MRCP which patient has requested that we arrange locally in [place]  2. 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 Drue Dun, both are options, but J pouch would be definitive from a colon cancer prevention standpoint. I will provide her with contact information for a patient who has undergone colectomy to discuss.  3. PSC: Liver biopsy was consistent with PSC, also MRI/MRCP consistent with PSC.  LFTs are reassuring, synthetic function is normal. Repeat MRI/MRCP prior to surgery.  4.  Alopecia: She now has had some improvement with tofacitinib, this is also been reported in the literature indicates report format for alopecia that [name] can be effective.  5. Prevention: She is up-to-date on Prevnar 13 as well as PPSV23 vaccine. She is up to date on Tdap and influenza vaccine.   She had covid vaccine in 02/2020. sunscreen use, as well as screening skin examinations.  6.  Follow-up in clinic in 3 months.            Neida Ellegood D. Maxime Beckner MD, MPH  Associate Professor of Medicine  [facility]          DIAGNOSTIC STUDIES:  I have reviewed all pertinent diagnostic studies, including:    GI procedures:    05/2020 colonoscopy  Impression:            - Mayo Score 1 with patchy areas of Mayo 2 in the                          ascending colon and cecum. Targeted surveillence                          biopsies taken with chromoscopy performed. Overall  markedly improved from prior examination.                         - The examined portion of the ileum was normal.                         - Very mild inflammation was found. This was graded as                          Mayo Score 1 (mild disease), markedly improved                          compared to previous examinations.                         - Anal papilla(e) were hypertrophied.                         - Several biopsies were obtained in the sigmoid colon,                          in the descending colon, in the transverse colon and                          in the ascending colon.                         - Chromoscopy was performed. - Biopsies were taken with a cold forceps for                          histology in the rectum, in the sigmoid colon, in the                          descending colon, in the transverse colon, in the                          ascending colon and in the cecum.    Pathology:  A: Colon, ascending, biopsy  ???Moderate chronic active colitis with multiple foci of villiform low to high-grade dysplasia; see comment    B: Colon, transverse, biopsy  ???Moderate chronic active colitis with multiple foci of low-grade dysplasia and foci suspicious for high-grade dysplasia; see comment    C: Colon, descending, biopsy  ???Mild chronic active colitis with focus of low-grade dysplasia; see comment    D: Colon, rectosigmoid, biopsy  ???Mild to moderate chronic active colitis  ???No evidence of dysplasia    Comment    Drs. Trembath and Iuga have reviewed the routine H&E histology of this case and Dr. Felipa Emory concurs. Dr. Arlyss Gandy also concurs, except she believes that part A represents at least low-grade dysplasia.  Immunohistochemical stain for p53 was utilized on parts A???C which supports the diagnosis           GI Procedures:  08/2016 Colonoscopy       - Preparation of the colon was fair. This was improved                        to adequate with lavage.                       -  Abnormal perianal exam with anal canal tenderness.                       - Congested, erythematous, hemorrhagic, ulcerated and                        vascular-pattern-decreased mucosa in the entire examined                        colon. Biopsied from the left and right colon for                        surveillance.  Pathology:  A: Colon, ascending, biopsy   - Diffuse moderate to severe chronic active colitis with reactive epithelial atypia, negative for dysplasia  - Ulceration with inflamed granulation tissue  - No CMV viral cytopathic effect or granulomas identified  ??  B: Colon, descending, biopsy   - Diffuse moderate to severe chronic active colitis, negative for dysplasia  - Ulceration with inflamed granulation tissue  - No CMV viral cytopathic effect or granulomas identified    08/2014 colonoscopy  Impression: - Congested, erythematous and ulcerated mucosa in the   entire examined colon with patchy distrubution.   Biopsied. Consistent with moderate activity of Crohn's   colitis.  - The examined portion of the ileum was normal.  - The distal rectum and anal verge are normal on   retroflexion view.    Pathology:  A: Colon, right, biopsy  - Moderate to severe chronic active colitis with crypt depletion and erosion,  negative for dysplasia  - No granulomas identified    B: Colon, left, biopsy   - Moderate chronic active colitis, negative for dysplasia  - No granulomas identified      2012 colonoscopy:  Impression: - Erythematous and granular mucosa in the rectum. This was biopsied. This was consistent with mild activity of Crohn's disease. - Erythematous, hemorrhagic, inflamed and ulcerated mucosa in the descending colon, in the transverse colon, in the ascending colon and in the cecum. This was biopsied. This was consistent with moderate to severe activity of Crohn's disease. - Congested mucosa in the terminal ileum for 5-10 cm, normal ileum above this level.     Pathology:  A: Small bowel, terminal ileum, biopsy - Severe chronic active enteritis, consistent with IBD, negative for dysplasia - Ulceration with inflamed granulation tissue - No CMV viral cytopathic effect identified B: Colon, right, biopsy - Moderate to severe chronic active colitis, negative for dysplasia - No CMV viral cytopathic effect identified. C: Colon, left, biopsy - Moderate to severe chronic active colitis, negative for dysplasia - No CMV viral cytopathic effect identified.       LIVER BIOPSY   A: Liver, core biopsy   - Chronic hepatitis, etiology undetermined, grade 1, stage 0 (see comment)     Comment:  The histologic features are those of a low grade chronic hepatitis process without increased numbers of plasma cells. The differential diagnosis includes  viral and drug etiologies and autoimmune hepatitis is not favored.       Radiology results:    MRI/MRCP 2018  Impression     --Little interval change from 2017, including appearance of the biliary ducts suggestive of PSC.   --Patchy arterial enhancement, probably perfusional, less likely related to acute inflammatory process.         MRI/MRCP 2017    -- Mild multifocal beading /  strictures of the bile ducts associated with slight multifocal dilatation in the intrahepatic bile ducts. These findings are suggestive of primary sclerosing cholangitis in this patient with history of Crohn's disease.  --Edema, thickening and enhancement of the transverse and descending colon wall associated with stranding, mesocolic engorgement and associated prominent lymph nodes. These findings are suggestive of acute on chronic inflammation secondary to Crohn's colitis.  --Prominent mesocolic and omental and right lower quadrant lymph nodes. These are likely reactive and secondary to Crohn's disease.   --The gallbladder was hydropic at the time the exam. This could be due to Kamilah Correia fasting. No evidence of acute cholecystitis or obstruction at the level of the hepatic ducts.      Laboratory results:    No visits with results within 1 Week(s) from this visit.   Latest known visit with results is:   Admission on 05/22/2020, Discharged on 05/22/2020   Component Date Value Ref Range Status   ??? Final Diagnosis 05/22/2020    Final                    Value:This result contains rich text formatting which cannot be displayed here.   ??? Comment 05/22/2020    Final                    Value:This result contains rich text formatting which cannot be displayed here.   ??? Clinical History 05/22/2020    Final                    Value:This result contains rich text formatting which cannot be displayed here.   ??? Gross Description 05/22/2020    Final                    Value:This result contains rich text formatting which cannot be displayed here.   ??? Microscopic Description 05/22/2020    Final                    Value:This result contains rich text formatting which cannot be displayed here.   ??? Disclaimer 05/22/2020    Final                    Value:This result contains rich text formatting which cannot be displayed here.

## 2020-06-04 NOTE — Unmapped (Signed)
1. Continue Xeljanz  2. Dr. Drue Dun visit from colorectal surgery Aug 4  3. MRI/MRCP in [place] end of July   4. Will provide contact information for a patient who has undergone J pouch should you want to talk to someone  5. Follow up in 3 months

## 2020-06-12 DIAGNOSIS — K5100 Ulcerative (chronic) pancolitis without complications: Principal | ICD-10-CM

## 2020-06-18 ENCOUNTER — Other Ambulatory Visit (HOSPITAL_COMMUNITY): Payer: Self-pay | Admitting: Internal Medicine

## 2020-06-18 ENCOUNTER — Other Ambulatory Visit: Payer: Self-pay | Admitting: Internal Medicine

## 2020-06-18 DIAGNOSIS — K8301 Primary sclerosing cholangitis: Secondary | ICD-10-CM

## 2020-06-18 DIAGNOSIS — K50919 Crohn's disease, unspecified, with unspecified complications: Secondary | ICD-10-CM

## 2020-06-19 ENCOUNTER — Other Ambulatory Visit (HOSPITAL_COMMUNITY): Payer: Self-pay | Admitting: Internal Medicine

## 2020-06-19 DIAGNOSIS — K8301 Primary sclerosing cholangitis: Secondary | ICD-10-CM

## 2020-06-19 DIAGNOSIS — K50919 Crohn's disease, unspecified, with unspecified complications: Secondary | ICD-10-CM

## 2020-06-20 NOTE — Unmapped (Signed)
Duplicate for MRI/MRCP documentation

## 2020-07-01 ENCOUNTER — Other Ambulatory Visit (HOSPITAL_COMMUNITY): Payer: Self-pay | Admitting: Internal Medicine

## 2020-07-01 ENCOUNTER — Other Ambulatory Visit: Payer: Self-pay

## 2020-07-01 ENCOUNTER — Ambulatory Visit (HOSPITAL_COMMUNITY)
Admission: RE | Admit: 2020-07-01 | Discharge: 2020-07-01 | Disposition: A | Discharge status: Home / Self Care | Payer: BC Managed Care – PPO | Source: Ambulatory Visit | Attending: Internal Medicine | Admitting: Internal Medicine

## 2020-07-01 DIAGNOSIS — K50919 Crohn's disease, unspecified, with unspecified complications: Secondary | ICD-10-CM

## 2020-07-01 DIAGNOSIS — K8301 Primary sclerosing cholangitis: Secondary | ICD-10-CM | POA: Insufficient documentation

## 2020-07-01 MED ORDER — GADOBUTROL 1 MMOL/ML IV SOLN
5.0000 mL | Freq: Once | INTRAVENOUS | Status: AC | PRN
  Administered 2020-07-01: 5 mL via INTRAVENOUS

## 2020-07-02 NOTE — Unmapped (Signed)
[facility] Clinical Assessment & Refill Coordination Note    Helen Calhoun, DOB: 05/22/1991  Phone: [phone] (home)     All above HIPAA information was verified with patient.     Was a Nurse, learning disability used for this call? No    Specialty Medication(s):   Inflammatory Disorders: Helen Calhoun     Current Outpatient Medications   Medication Sig Dispense Refill   ??? clonazePAM (KLONOPIN) 0.5 MG tablet Take 0.5 mg by mouth.     ??? FLUoxetine (PROZAC) 20 MG tablet Take by mouth.     ??? loratadine (CLARITIN) 10 mg tablet Take 10 mg by mouth daily as needed.  (Patient not taking: Reported on 04/16/2020)     ??? MICROGESTIN FE 1/20, 28, 1 mg-20 mcg (21)/75 mg (7) per tablet Take 1 tablet by mouth daily. 84 tablet 4   ??? tofacitinib (XELJANZ) 5 mg Tab tablet Take 1 tablet (5 mg total) by mouth Two (2) times a day. 180 tablet 3   ??? tofacitinib (XELJANZ) 5 mg Tab tablet Take 1 tablet (5 mg total) by mouth Two (2) times a day. 180 tablet 3     No current facility-administered medications for this visit.        Changes to medications: [name] reports no changes at this time.    No Known Allergies    Changes to allergies: No    SPECIALTY MEDICATION ADHERENCE     Xeljanz 5 mg: 7 days of medicine on hand       Medication Adherence    Patient reported X missed doses in the last month: 1  Specialty Medication: Helen Calhoun 5mg    Patient is on additional specialty medications: No          Specialty medication(s) dose(s) confirmed: Regimen is correct and unchanged.     Are there any concerns with adherence? Yes: patient reported that she forgot one dose    Adherence counseling provided? Yes: discussed potential to store the medicine somewhere that would help her remember to take it or use reminders on her phone.    CLINICAL MANAGEMENT AND INTERVENTION      Clinical Benefit Assessment:    Do you feel the medicine is effective or helping your condition? Yes    Clinical Benefit counseling provided? Not needed Adverse Effects Assessment:    Are you experiencing any side effects? No    Are you experiencing difficulty administering your medicine? No    Quality of Life Assessment:    How many days over the past month did your GI condition  keep you from your normal activities? For example, brushing your teeth or getting up in the morning. 0    Have you discussed this with your provider? Not needed    Therapy Appropriateness:    Is therapy appropriate? Yes, therapy is appropriate and should be continued    DISEASE/MEDICATION-SPECIFIC INFORMATION      N/A    PATIENT SPECIFIC NEEDS     - Does the patient have any physical, cognitive, or cultural barriers? No    - Is the patient high risk? No     - Does the patient require a Care Management Plan? No     - Does the patient require physician intervention or other additional services (i.e. nutrition, smoking cessation, social work)? No      SHIPPING     Specialty Medication(s) to be Shipped:   Inflammatory Disorders: Helen Calhoun    Other medication(s) to be shipped: No additional medications  requested for fill at this time     Changes to insurance: No    Delivery Scheduled: Yes, Expected medication delivery date: 7/30.     Medication will be delivered via UPS to the confirmed prescription address in [facility].    The patient will receive a drug information handout for each medication shipped and additional FDA Medication Guides as required.  Verified that patient has previously received a Conservation officer, historic buildings.    All of the patient's questions and concerns have been addressed.    Clydell Hakim   [facility] Specialty Pharmacist

## 2020-07-04 MED FILL — XELJANZ 5 MG TABLET: ORAL | 30 days supply | Qty: 60 | Fill #2

## 2020-07-04 MED FILL — XELJANZ 5 MG TABLET: 30 days supply | Qty: 60 | Fill #2 | Status: AC

## 2020-07-05 NOTE — Unmapped (Signed)
MRI/MRCP results received and forwarded to Dr. Jacqulyn Bath to review. Results scanned to [facility] EMR

## 2020-07-15 DIAGNOSIS — Z79899 Other long term (current) drug therapy: Principal | ICD-10-CM

## 2020-07-15 DIAGNOSIS — K5010 Crohn's disease of large intestine without complications: Principal | ICD-10-CM

## 2020-07-26 NOTE — Unmapped (Unsigned)
***   8/20 called to coordinate Xeljanz refill left message to call back

## 2020-08-02 ENCOUNTER — Telehealth
Admit: 2020-08-02 | Discharge: 2020-08-03 | Payer: PRIVATE HEALTH INSURANCE | Attending: Internal Medicine | Primary: Internal Medicine

## 2020-08-02 NOTE — Unmapped (Signed)
Discussion of surgical options  Plan to continue on Xeljanz, but hold perioperatively  Will connect her with a paitent who has had a J pouch to discuss this

## 2020-08-02 NOTE — Unmapped (Addendum)
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 with a past medical history significant for alopecia universalis and Crohn's (PSC-IBD) colitis. She has previously failed multiple therapies including:  Remicade, humira, cimzia, mtx, 6mp (including combination therapy), stelara ([place] dosing off label, no prior infusion), vedolizumab therapy (partial response) and now on tofacitinib maintenance at 5 mg PO  BID. Patient also with MRI/MRCP which showed stricturing and beading of the ducts consistent with PSC, liver biopsy also consistent with PSC, without any evidence of autoimmune overlap or DILI.  She underwent surveillance colonoscopy (the annual need for this had been emphasized to patient, due in 2018 but not performed to 2021 due to lack of patient follow up),  which demonstrated multifocal high grade dysplasia. She has seen Dr. Drue Dun for consideration of colectomy.    Interval history:  She has met with Dr. Drue Dun and liked her, with plans to proceed with surgery in October. She wanted to discuss a few aspects of the surgery. We discussed 2 main options: colectomy/IPAA or colectomy with ileorectal anastomosis. While she previously has been diagnosed with crohn's colitis due to more right sided inflammation and backwash ileitis,  Since her PSC diagnosis,  I feel that this is most consistent with PSC-IBD (not CD), which is not a contraindication to pouch. We also discussed that she is very anxious about her cancer risk, but ultimately really wants to avoid a permanent ileostomy. We discussed that patients with PSC are more likely to have pouchitis, and that this can require treatment early. She is very interested in staying on her [name] post operatively, as this has dramatically improved her joint pains, bowel symptoms and even her hair regrowth. She is also anxious about her cancer risk and for this reason is hesitant to leave her rectum in place. She is interested in talking to a patient who has had a J pouch.    Currnetly, she is feeling well, having regular bowel movements, no joint pain, no rectal bleeding, fatigue is improved. She is having hair regrowth.      PROBLEMS:  Patient Active Problem List   Diagnosis   ??? Crohn's colitis (CMS-HCC)   ??? Alopecia areata   ??? Chronic rhinitis   ??? Acute tonsillitis   ??? Encounter for gynecological examination (general) (routine) without abnormal findings   ??? Atypical squamous cells of undetermined significance on cytologic smear of cervix (ASC-US)         PAST MEDICAL HISTORY:    Past Medical History:   Diagnosis Date   ??? Abnormal LFTs    ??? Alopecia areata totalis    ??? Conjunctivitis    ??? Crohn's disease (CMS-HCC)    ??? HPV (human papilloma virus) infection     has had the HPV vaccine but had an abnormal pap and colpo that was negative.    ??? PSC (primary sclerosing cholangitis)    ??? Sinusitis    ??? Streptococcal sore throat        INFLAMMATORY BOWEL DISEASE COURSE/PHENOTYPE:    Crohn's disease, originally diagnosed in 2008 with ileocolonic inflammatory involvement, biopsies demonstrated chronic active colitis that was patchy in involvement. Colonoscopy 2015, which demonstrated moderate to severe activity in  the right colon, with inflammation in the distal terminal ileum.  Prior treatment therapies have included Remicade, Humira, cimzia, methotrexate, 6-MP, stelara and then maintained on vedolizumab monotherapy. MRI/MRCP 2017 with diagnosis of PSC. Colon 08/2016 with pancolitis, path with diffuse mod-severe chronic active colitis. 2018: start of tofacitinib, with clinical response, tapered off of vedolizumab, now on maintenance 5 mg PO BID. Colonoscopy showed multifocal high grade dysplasia in 2021. Colectomy recommended, she has PSC-IBD which is more right sided, with backwash ileitis.      ALLERGIES:    Patient has no known allergies.    SOCIAL HISTORY: Working in [place],  Forensic scientist.    Social History     Socioeconomic History   ??? Marital status: Single     Spouse name: Not on file   ??? Number of children: Not on file   ??? Years of education: Not on file   ??? Highest education level: Not on file   Occupational History   ??? Not on file   Tobacco Use   ??? Smoking status: Never Smoker   ??? Smokeless tobacco: Never Used   Vaping Use   ??? Vaping Use: Never used   Substance and Sexual Activity   ??? Alcohol use: Yes     Alcohol/week: 0.0 standard drinks     Comment: 4 drinks/week    ??? Drug use: No   ??? Sexual activity: Not on file   Other Topics Concern   ??? 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 Difficulty of Paying Living Expenses:    Food Insecurity:    ??? Worried About Programme researcher, broadcasting/film/video in the Last Year:    ??? Barista in the Last Year:    Transportation Needs:    ??? Freight forwarder (Medical):    ??? Lack of Transportation (Non-Medical):    Physical Activity:    ??? Days of Exercise per Week:    ??? Minutes of Exercise per Session:    Stress:    ??? Feeling of Stress :    Social Connections:    ??? Frequency of Communication with Friends and Family:    ??? Frequency of Social Gatherings with Friends and Family:    ??? Attends Religious Services:    ??? Database administrator or Organizations:    ??? Attends Engineer, structural:    ??? Marital Status:        FAMILY HISTORY:    family history includes Breast cancer (age of onset: 65) in her maternal grandmother; Multiple sclerosis in her maternal uncle.      REVIEW OF SYSTEMS:     The balance of 12 systems reviewed is negative except as noted in the HPI. VITAL SIGNS:    There were no vitals taken for this visit.    PHYSICAL EXAM:    (PE done via video visit, the following elements were visualed by telehealth):    Constitutional: Appears well, no apparent distress  HENT: Head: Normocephalic, atraumatic               Neck: no visible thyroid enlargement  Eyes: Extraocular movements intact  Pulmonary: Normal respiratory  effort  Cardiovascular: No visible edema  Musculoskeletal: Appears normal, no swelling or deformity  Skin: Normal color and no visible rash  Neurologic: Alert and oriented x3, cranial nerves grossly intact  Psychiatric: normal mood, affect and behaviour     ASSESSMENT:        29 year old white female with PSC-IBD, PSC, psoriasis, and alopecia currently maintained on tofacitinib.  She is currently in clinical remission.  Her extraintestinal manifestations have resolved.  She has multifocal high grade dysplasia on surveillance chromoendoscopy and colectomy has been recommended. We discussed 2 options with her PSC-IBD, including colectomy/IPAA and also ileorectal anastomosis. We discussed that I would recommend staying on Xejljanz after surgery (due to her [facility] and risk of pouchitis/recurrent inflammation with IPAA or for management of the rectum with ileorectal). We will hold this approximately 48 hours prior to surgery and then post op for about 2 weeks to ensure appropriate healing and then restart. We discussed with J pouch, this would be most likely a 3 stage surgery. She is anxious about cancer risk, and is leaning towards J pouch.       PLAN:          1.  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 twice daily.  Will continue this up until surgery.    2.  Dysplasia - recommend colectomy, with PSC-IBD, she is a candidate for J pouch. She will discuss this surgery with another one of my patients who previously had this surgery.  She will schedule a pre-op visit with Dr. Drue Dun next month, and surgery in October.    3. PSC: Liver biopsy was consistent with PSC, also MRI/MRCP consistent with PSC.  LFTs have now normal. She no longer has itching.  We will continue to monitor , had a recent MRI/MRP that showed no malignancy and stable changes of PSC.    4.  Alopecia: She now has had some improvement with tofacitinib, this is also been reported in the literature indicates report format for alopecia that [name] can be effective.    5. Prevention: She is up-to-date on Prevnar 13 as well as PPSV23 vaccine.  Encouraged annual influenza vaccine. Got Pfizer COVID vaccine.  I emphasized the need for sunscreen use, as well as screening skin examinations. She will receive a COVID 3rd vaccine dose as she is eligible on the xeljanz.          I spent 30 minutes on the real-time audio and video visit with the patient on the date of service. I spent an additional 10 minutes on pre- and post-visit activities on the date of service.     The patient was not located and I was located within 250 yards of a hospital based location during the real-time audio and video visit. The patient was physically located in [place] or a state in which I am permitted to provide care. The patient and/or parent/guardian understood that s/he may incur co-pays and cost sharing, and agreed to the telemedicine visit. The visit was reasonable and appropriate under the circumstances given the patient's presentation at the time.    The patient and/or parent/guardian has been advised of the potential risks and limitations of this mode of treatment (including, but not limited to, the absence of in-person examination) and has agreed to be treated using telemedicine. The patient's/patient's family's questions regarding telemedicine have been answered.    If the visit was completed in an ambulatory setting, the patient and/or parent/guardian has also been advised to  contact their provider???s office for worsening conditions, and seek emergency medical treatment and/or call 911 if the patient deems either necessary.      I personally spent  40 minutes face-to-face and non-face-to-face in the care of this patient, which includes all pre, intra, and post visit time on the date of service.          Bernese Doffing D. Daneli Butkiewicz MD, MPH  Associate Professor of Medicine  [employer] [facility]        DIAGNOSTIC STUDIES:  I have reviewed all pertinent diagnostic studies, including:    Liver biopsy:  01/2018  A: Liver, core biopsy   - Mild fibrosis portal expansion and positive copper staining in many periportal hepatocytes (see comment)  - Rare spotty lobular inflammation and rare portal and lobular ceroid laden macrophages    GI Procedures:    Colonoscopy 05/2020    Impression:            - Mayo Score 1 with patchy areas of Mayo 2 in the                          ascending colon and cecum. Targeted surveillence                          biopsies taken with chromoscopy performed. Overall                          markedly improved from prior examination.                         - The examined portion of the ileum was normal.                         - Very mild inflammation was found. This was graded as                          Mayo Score 1 (mild disease), markedly improved                          compared to previous examinations.                         - Anal papilla(e) were hypertrophied.                         - Several biopsies were obtained in the sigmoid colon,                          in the descending colon, in the transverse colon and                          in the ascending colon.                         - Chromoscopy was performed.                         - Biopsies were taken with a cold forceps for  histology in the rectum, in the sigmoid colon, in the                          descending colon, in the transverse colon, in the                          ascending colon and in the cecum.        Pathology:    08/2016 Colonoscopy       - Preparation of the colon was fair. This was improved                        to adequate with lavage.                       - Abnormal perianal exam with anal canal tenderness.                       - Congested, erythematous, hemorrhagic, ulcerated and                        vascular-pattern-decreased mucosa in the entire examined                        colon. Biopsied from the left and right colon for                        surveillance.  Pathology:  A: Colon, ascending, biopsy   - Diffuse moderate to severe chronic active colitis with reactive epithelial atypia, negative for dysplasia  - Ulceration with inflamed granulation tissue  - No CMV viral cytopathic effect or granulomas identified  ??  B: Colon, descending, biopsy   - Diffuse moderate to severe chronic active colitis, negative for dysplasia  - Ulceration with inflamed granulation tissue  - No CMV viral cytopathic effect or granulomas identified    Pathology:  A: Colon, ascending, biopsy  ???Moderate chronic active colitis with multiple foci of villiform low to high-grade dysplasia; see comment    B: Colon, transverse, biopsy  ???Moderate chronic active colitis with multiple foci of low-grade dysplasia and foci suspicious for high-grade dysplasia; see comment    C: Colon, descending, biopsy  ???Mild chronic active colitis with focus of low-grade dysplasia; see comment    D: Colon, rectosigmoid, biopsy  ???Mild to moderate chronic active colitis  ???No evidence of dysplasia      08/2014 colonoscopy  Impression: - Congested, erythematous and ulcerated mucosa in the   entire examined colon with patchy distrubution.   Biopsied. Consistent with moderate activity of Crohn's   colitis.  - The examined portion of the ileum was normal.  - The distal rectum and anal verge are normal on   retroflexion view.    Pathology:  A: Colon, right, biopsy  - Moderate to severe chronic active colitis with crypt depletion and erosion,  negative for dysplasia  - No granulomas identified    B: Colon, left, biopsy   - Moderate chronic active colitis, negative for dysplasia  - No granulomas identified      2012 colonoscopy:  Impression: - Erythematous and granular mucosa in the rectum. This was biopsied. This was consistent with mild activity of Crohn's disease. - Erythematous, hemorrhagic, inflamed and ulcerated mucosa in the descending colon, in the transverse colon, in  the ascending colon and in the cecum. This was biopsied. This was consistent with moderate to severe activity of Crohn's disease. - Congested mucosa in the terminal ileum for 5-10 cm, normal ileum above this level.     Pathology:  A: Small bowel, terminal ileum, biopsy - Severe chronic active enteritis, consistent with IBD, negative for dysplasia - Ulceration with inflamed granulation tissue - No CMV viral cytopathic effect identified B: Colon, right, biopsy - Moderate to severe chronic active colitis, negative for dysplasia - No CMV viral cytopathic effect identified. C: Colon, left, biopsy - Moderate to severe chronic active colitis, negative for dysplasia - No CMV viral cytopathic effect identified.       LIVER BIOPSY   A: Liver, core biopsy   - Chronic hepatitis, etiology undetermined, grade 1, stage 0 (see comment)     Comment:  The histologic features are those of a low grade chronic hepatitis process  without increased numbers of plasma cells. The differential diagnosis includes  viral and drug etiologies and autoimmune hepatitis is not favored.       Radiology results:    MRI/MRCP 2018  Impression     --Little interval change from 2017, including appearance of the biliary ducts suggestive of PSC.   --Patchy arterial enhancement, probably perfusional, less likely related to acute inflammatory process.         MRI/MRCP 2017    -- Mild multifocal beading / strictures of the bile ducts associated with slight multifocal dilatation in the intrahepatic bile ducts. These findings are suggestive of primary sclerosing cholangitis in this patient with history of Crohn's disease.  --Edema, thickening and enhancement of the transverse and descending colon wall associated with stranding, mesocolic engorgement and associated prominent lymph nodes. These findings are suggestive of acute on chronic inflammation secondary to Crohn's colitis.  --Prominent mesocolic and omental and right lower quadrant lymph nodes. These are likely reactive and secondary to Crohn's disease.   --The gallbladder was hydropic at the time the exam. This could be due to Fraida Veldman fasting. No evidence of acute cholecystitis or obstruction at the level of the hepatic ducts.      Laboratory results:    No visits with results within 1 Week(s) from this visit.   Latest known visit with results is:   Admission on 05/22/2020, Discharged on 05/22/2020   Component Date Value Ref Range Status   ??? Final Diagnosis 05/22/2020    Final                    Value:This result contains rich text formatting which cannot be displayed here.   ??? Comment 05/22/2020    Final                    Value:This result contains rich text formatting which cannot be displayed here.   ??? Clinical History 05/22/2020    Final                    Value:This result contains rich text formatting which cannot be displayed here.   ??? Gross Description 05/22/2020    Final                    Value:This result contains rich text formatting which cannot be displayed here.   ??? Microscopic Description 05/22/2020    Final                    Value:This result contains rich  text formatting which cannot be displayed here.   ??? Disclaimer 05/22/2020    Final                    Value:This result contains rich text formatting which cannot be displayed here.

## 2020-08-07 NOTE — Unmapped (Unsigned)
[facility] Refill Coordination Note    Specialty Medication(s) to be Shipped:   Inflammatory Disorders: Helen Calhoun    Other medication(s) to be shipped: No additional medications requested for fill at this time     Helen Calhoun, DOB: June 16, 1991  Phone: [phone] (home)       All above HIPAA information was verified with patient.     Was a Nurse, learning disability used for this call? No    Completed refill call assessment today to schedule patient's medication shipment from the [facility] [phone]).       Specialty medication(s) and dose(s) confirmed: Regimen is correct and unchanged.   Changes to medications: Helen Calhoun reports no changes at this time.  Changes to insurance: No  Questions for the pharmacist: No    Confirmed patient received Welcome Packet with first shipment. The patient will receive a drug information handout for each medication shipped and additional FDA Medication Guides as required.       DISEASE/MEDICATION-SPECIFIC INFORMATION        N/A    SPECIALTY MEDICATION ADHERENCE     Medication Adherence    Patient reported X missed doses in the last month: 0  Specialty Medication: Helen Calhoun  Patient is on additional specialty medications: No  Patient is on more than two specialty medications: No  Any gaps in refill history greater than 2 weeks in the last 3 months: no  Demonstrates understanding of importance of adherence: yes  Informant: patient                Helen Calhoun 5mg : Patient has 5 days of medication on hand      SHIPPING     Shipping address confirmed in Epic.     Delivery Scheduled: Yes, Expected medication delivery date: 9/3.     Medication will be delivered via UPS to the prescription address in Epic WAM.    Olga Millers   [facility] Specialty Technician

## 2020-08-08 MED FILL — XELJANZ 5 MG TABLET: 30 days supply | Qty: 60 | Fill #3 | Status: AC

## 2020-08-08 MED FILL — XELJANZ 5 MG TABLET: ORAL | 30 days supply | Qty: 60 | Fill #3

## 2020-08-27 ENCOUNTER — Other Ambulatory Visit (HOSPITAL_COMMUNITY): Payer: Self-pay | Admitting: Psychiatry

## 2020-08-29 NOTE — Unmapped (Signed)
[facility] Refill Coordination Note    Specialty Medication(s) to be Shipped:   Inflammatory Disorders: Helen Calhoun    Other medication(s) to be shipped: No additional medications requested for fill at this time     Helen Calhoun, DOB: 02-13-1991  Phone: [phone] (home)       All above HIPAA information was verified with patient.     Was a Nurse, learning disability used for this call? No    Completed refill call assessment today to schedule patient's medication shipment from the [facility] [phone]).       Specialty medication(s) and dose(s) confirmed: Regimen is correct and unchanged.   Changes to medications: Helen Calhoun reports no changes at this time.  Changes to insurance: No  Questions for the pharmacist: No    Confirmed patient received Welcome Packet with first shipment. The patient will receive a drug information handout for each medication shipped and additional FDA Medication Guides as required.       DISEASE/MEDICATION-SPECIFIC INFORMATION        N/A    SPECIALTY MEDICATION ADHERENCE     Medication Adherence    Patient reported X missed doses in the last month: 0  Specialty Medication: Helen Calhoun 5mg    Patient is on additional specialty medications: No  Informant: patient                Helen Calhoun  5 mg: not sure days of medicine on hand         SHIPPING     Shipping address confirmed in Epic.     Delivery Scheduled: Yes, Expected medication delivery date: 9/28.     Medication will be delivered via UPS to the prescription address in Epic WAM.    Helen Calhoun   [facility] Specialty Pharmacist

## 2020-08-30 ENCOUNTER — Telehealth (INDEPENDENT_AMBULATORY_CARE_PROVIDER_SITE_OTHER): Payer: BC Managed Care – PPO | Admitting: Psychiatry

## 2020-08-30 ENCOUNTER — Other Ambulatory Visit: Payer: Self-pay

## 2020-08-30 DIAGNOSIS — F411 Generalized anxiety disorder: Secondary | ICD-10-CM | POA: Diagnosis not present

## 2020-08-30 MED ORDER — CLONAZEPAM 0.5 MG PO TABS
0.5000 mg | ORAL_TABLET | Freq: Two times a day (BID) | ORAL | 1 refills | Status: DC | PRN
Start: 2020-08-30 — End: 2020-10-24

## 2020-08-30 NOTE — Progress Notes (Signed)
BH MD/PA/NP OP Progress Note  08/30/2020 9:16 AM Angela Little  MRN:  [id number] Interview was conducted using videoconferencing application and I verified that I was speaking with the correct person using two identifiers. I discussed the limitations of evaluation and management by telemedicine and  the availability of in person appointments. Patient expressed understanding and agreed to proceed. Patient location - home; physician - home office.  Chief Complaint: Anxiety.  HPI: 29 yo single WF with generalized anxiety disorder. Overat least past2 years she has beenexcessivelyworrying, feelingtension, inability to relax which has eventually lead to poor appetite ("I feel I have a constant nod in my stomach") and difficulties with sleep. Anxiety has worsened over past two moths and she started to feel excessively tired and depressed. She denies having panic attacks. She cannot identify specific stressors which could have triggered her anxiety but medical problems which she has been dealing with since adolescence (Crohn's disease, alopecia) appear to play a significant partalthough she is now in remission of symptoms(on Xeljenz). She had been in counseling in the past and just started seeing new counselor three weeks ago.We have started fluoxetine five months ago - she tolerates it well and reports less anxiety. I also added clonazepam prn anxiety which she uses mostly to help with falling asleep and occasionally for anxiety. Debborah will have a small intestine surgery on October 25th - her GI providers now lean towards dx of UC.    Visit Diagnosis:    ICD-10-CM   1. GAD (generalized anxiety disorder)  F41.1     Past Psychiatric History: Please see intake H&P.  Past Medical History:  Past Medical History:  Diagnosis Date  . Alopecia   . Crohn's disease (HCC)   . Mononucleosis 02/19/2012   History 2012  . Pharyngitis 02/19/2012   No past surgical history on file.  Family  Psychiatric History: Reviewed.  Family History:  Family History  Problem Relation Age of Onset  . Arthritis Other   . Cancer Other        colon cancer  . Hyperlipidemia Other   . Hypertension Other   . Alcohol abuse Cousin     Social History:  Social History   Socioeconomic History  . Marital status: Single    Spouse name: Not on file  . Number of children: Not on file  . Years of education: Not on file  . Highest education level: Not on file  Occupational History  . Occupation: Social worker  Tobacco Use  . Smoking status: Never Smoker  . Smokeless tobacco: Never Used  Vaping Use  . Vaping Use: Never used  Substance and Sexual Activity  . Alcohol use: No  . Drug use: No  . Sexual activity: Not on file  Other Topics Concern  . Not on file  Social History Narrative  . Not on file   Social Determinants of Health   Financial Resource Strain:   . Difficulty of Paying Living Expenses: Not on file  Food Insecurity:   . Worried About Programme researcher, broadcasting/film/video in the Last Year: Not on file  . Ran Out of Food in the Last Year: Not on file  Transportation Needs:   . Lack of Transportation (Medical): Not on file  . Lack of Transportation (Non-Medical): Not on file  Physical Activity:   . Days of Exercise per Week: Not on file  . Minutes of Exercise per Session: Not on file  Stress:   . Feeling of Stress : Not on  file  Social Connections:   . Frequency of Communication with Friends and Family: Not on file  . Frequency of Social Gatherings with Friends and Family: Not on file  . Attends Religious Services: Not on file  . Active Member of Clubs or Organizations: Not on file  . Attends Banker Meetings: Not on file  . Marital Status: Not on file    Allergies: No Known Allergies  Metabolic Disorder Labs: No results found for: HGBA1C, MPG No results found for: PROLACTIN No results found for: CHOL, TRIG, HDL, CHOLHDL, VLDL, LDLCALC No results found  for: TSH  Therapeutic Level Labs: No results found for: LITHIUM No results found for: VALPROATE No components found for:  CBMZ  Current Medications: Current Outpatient Medications  Medication Sig Dispense Refill  . Adalimumab (HUMIRA PEN [place]) Inject into the skin once a week.    . clonazePAM (KLONOPIN) 0.5 MG tablet Take 1 tablet (0.5 mg total) by mouth 2 (two) times daily as needed for anxiety (sleep). 30 tablet 1  . FLUoxetine (PROZAC) 20 MG tablet TAKE 1 TABLET(20 MG) BY MOUTH DAILY 90 tablet 0  . METHOTREXATE, ANTI-RHEUMATIC, PO Take by mouth once a week.     No current facility-administered medications for this visit.       Psychiatric Specialty Exam: Review of Systems  Psychiatric/Behavioral: The patient is nervous/anxious.   All other systems reviewed and are negative.   There were no vitals taken for this visit.There is no height or weight on file to calculate BMI.  General Appearance: Casual and Well Groomed  Eye Contact:  Good  Speech:  Clear and Coherent and Normal Rate  Volume:  Normal  Mood:  Anxious  Affect:  Full Range  Thought Process:  Goal Directed  Orientation:  Full (Time, Place, and Person)  Thought Content: Logical   Suicidal Thoughts:  No  Homicidal Thoughts:  No  Memory:  Immediate;   Good Recent;   Good Remote;   Good  Judgement:  Good  Insight:  Good  Psychomotor Activity:  Normal  Concentration:  Concentration: Good  Recall:  Good  Fund of Knowledge: Good  Language: Good  Akathisia:  Negative  Handed:  Right  AIMS (if indicated): not done  Assets:  Communication Skills Desire for Improvement Financial Resources/Insurance Housing Leisure Time Resilience Social Support Vocational/Educational  ADL's:  Intact  Cognition: WNL  Sleep:  Fair     Assessment and Plan: 29 yo single WF with generalized anxiety disorder. Overat least past2 years she has beenexcessivelyworrying, feelingtension, inability to relax which has eventually  lead to poor appetite ("I feel I have a constant nod in my stomach") and difficulties with sleep. Anxiety has worsened over past two moths and she started to feel excessively tired and depressed. She denies having panic attacks. She cannot identify specific stressors which could have triggered her anxiety but medical problems which she has been dealing with since adolescence (Crohn's disease, alopecia) appear to play a significant partalthough she is now in remission of symptoms(on Xeljenz). She had been in counseling in the past and just started seeing new counselor three weeks ago.We have started fluoxetine five months ago - she tolerates it well and reports less anxiety. I also added clonazepam prn anxiety which she uses mostly to help with falling asleep and occasionally for anxiety. Charda will have a small intestine surgery on October 25th - her GI providers now lean towards dx of UC.   Dx: Generalized anxiety disorder  Plan:We  will continue fluoxetine 20 mg daily and clonazepam 0.5 mg prn anxiety/insomnia. She will continue in counseling and finds it very helpful. Next appointment in 6 weeks.The plan was discussed with patient who had an opportunity to ask questions and these were all answered. I spend67minutes in videoconferencingwith the patient.    Magdalene Patricia, MD 08/30/2020, 9:16 AM

## 2020-09-02 MED FILL — XELJANZ 5 MG TABLET: 30 days supply | Qty: 60 | Fill #4 | Status: AC

## 2020-09-02 MED FILL — XELJANZ 5 MG TABLET: ORAL | 30 days supply | Qty: 60 | Fill #4

## 2020-09-09 DIAGNOSIS — K5010 Crohn's disease of large intestine without complications: Principal | ICD-10-CM

## 2020-09-09 DIAGNOSIS — Z79899 Other long term (current) drug therapy: Principal | ICD-10-CM

## 2020-09-26 NOTE — Unmapped (Signed)
[facility] Refill Coordination Note    Specialty Medication(s) to be Shipped:   Inflammatory Disorders: [name] 5mg   Other medication(s) to be shipped: No additional medications requested for fill at this time     Helen [facility], DOB: Apr 15, 1991  Phone: [phone] (home)     All above HIPAA information was verified with patient.     Was a Nurse, learning disability used for this call? No    Completed refill call assessment today to schedule patient's medication shipment from the [facility] [phone]).       Specialty medication(s) and dose(s) confirmed: Regimen is correct and unchanged.   Changes to medications: [facility] reports no changes at this time.  Changes to insurance: No  Questions for the pharmacist: No    Confirmed patient received Welcome Packet with first shipment. The patient will receive a drug information handout for each medication shipped and additional FDA Medication Guides as required.       DISEASE/MEDICATION-SPECIFIC INFORMATION        N/A    SPECIALTY MEDICATION ADHERENCE     Medication Adherence    Patient reported X missed doses in the last month: 2  Specialty Medication: [name] 5mg   Patient is on additional specialty medications: No  Informant: patient  Reliability of informant: reliable  Reasons for non-adherence: patient forgets        [name] 5mg : 7 days of medicine on hand     SHIPPING     Shipping address confirmed in Epic.     Delivery Scheduled: Yes, Expected medication delivery date: 10/01/2020.     Medication will be delivered via UPS to the prescription address in Epic WAM.    Nathanael Krist P Wetzel [place] [facility] Specialty Technician

## 2020-09-30 MED FILL — XELJANZ 5 MG TABLET: ORAL | 30 days supply | Qty: 60 | Fill #5

## 2020-09-30 MED FILL — XELJANZ 5 MG TABLET: 30 days supply | Qty: 60 | Fill #5 | Status: AC

## 2020-10-24 ENCOUNTER — Telehealth (INDEPENDENT_AMBULATORY_CARE_PROVIDER_SITE_OTHER): Payer: BC Managed Care – PPO | Admitting: Psychiatry

## 2020-10-24 ENCOUNTER — Other Ambulatory Visit: Payer: Self-pay

## 2020-10-24 DIAGNOSIS — F411 Generalized anxiety disorder: Secondary | ICD-10-CM

## 2020-10-24 MED ORDER — CLONAZEPAM 0.5 MG PO TABS
0.5000 mg | ORAL_TABLET | Freq: Two times a day (BID) | ORAL | 1 refills | Status: DC | PRN
Start: 2020-10-24 — End: 2021-02-14

## 2020-10-24 MED ORDER — FLUOXETINE HCL 20 MG PO TABS
ORAL_TABLET | ORAL | 0 refills | Status: DC
Start: 2020-11-25 — End: 2020-11-25

## 2020-10-24 NOTE — Progress Notes (Signed)
BH MD/PA/NP OP Progress Note  10/24/2020 10:19 AM Angela Little  MRN:  [id number] Interview was conducted using videoconferencing application and I verified that I was speaking with the correct person using two identifiers. I discussed the limitations of evaluation and management by telemedicine and  the availability of in person appointments. Patient expressed understanding and agreed to proceed. Patient location - home; physician - home office.  Chief Complaint: Increased anxiety.  HPI: 29 yo single WF with generalized anxiety disorder. Overat least past2 years she has beenexcessivelyworrying, feelingtension, inability to relax which has eventually lead to poor appetite ("I feel I have a constant nod in my stomach") and difficulties with sleep. Anxiety has worsened over past two moths and she started to feel excessively tired and depressed. She denies having panic attacks. She cannot identify specific stressors which could have triggered her anxiety but medical problems which she has been dealing with since adolescence (UC, alopecia) appear to play a significant part. She is in counseling and finds it very helpful.We have started fluoxetine earlier this year - she tolerates it well and reported less anxiety. I also added clonazepam prn anxiety which she uses mostly to help with falling asleep and occasionally for anxiety. Angela Little had a laparascopic colectomy on October 25th, she is back to work (lighter work load) and "is dealing with colostomy issues". She will have second surgery in January. More anxious, increased use of clonazepam since surgery but overall her recovery has been uneventful.    Visit Diagnosis:    ICD-10-CM   1. GAD (generalized anxiety disorder)  F41.1     Past Psychiatric History: Please see intake H&P.  Past Medical History:  Past Medical History:  Diagnosis Date  . Alopecia   . Crohn's disease (HCC)   . Mononucleosis 02/19/2012   History 2012  .  Pharyngitis 02/19/2012   No past surgical history on file.  Family Psychiatric History: Reviewed.  Family History:  Family History  Problem Relation Age of Onset  . Arthritis Other   . Cancer Other        colon cancer  . Hyperlipidemia Other   . Hypertension Other   . Alcohol abuse Cousin     Social History:  Social History   Socioeconomic History  . Marital status: Single    Spouse name: Not on file  . Number of children: Not on file  . Years of education: Not on file  . Highest education level: Not on file  Occupational History  . Occupation: Social worker  Tobacco Use  . Smoking status: Never Smoker  . Smokeless tobacco: Never Used  Vaping Use  . Vaping Use: Never used  Substance and Sexual Activity  . Alcohol use: No  . Drug use: No  . Sexual activity: Not on file  Other Topics Concern  . Not on file  Social History Narrative  . Not on file   Social Determinants of Health   Financial Resource Strain:   . Difficulty of Paying Living Expenses: Not on file  Food Insecurity:   . Worried About Programme researcher, broadcasting/film/video in the Last Year: Not on file  . Ran Out of Food in the Last Year: Not on file  Transportation Needs:   . Lack of Transportation (Medical): Not on file  . Lack of Transportation (Non-Medical): Not on file  Physical Activity:   . Days of Exercise per Week: Not on file  . Minutes of Exercise per Session: Not on file  Stress:   .  Feeling of Stress : Not on file  Social Connections:   . Frequency of Communication with Friends and Family: Not on file  . Frequency of Social Gatherings with Friends and Family: Not on file  . Attends Religious Services: Not on file  . Active Member of Clubs or Organizations: Not on file  . Attends Banker Meetings: Not on file  . Marital Status: Not on file    Allergies: No Known Allergies  Metabolic Disorder Labs: No results found for: HGBA1C, MPG No results found for: PROLACTIN No results  found for: CHOL, TRIG, HDL, CHOLHDL, VLDL, LDLCALC No results found for: TSH  Therapeutic Level Labs: No results found for: LITHIUM No results found for: VALPROATE No components found for:  CBMZ  Current Medications: Current Outpatient Medications  Medication Sig Dispense Refill  . Adalimumab (HUMIRA PEN Hernandez) Inject into the skin once a week.    . clonazePAM (KLONOPIN) 0.5 MG tablet Take 1 tablet (0.5 mg total) by mouth 2 (two) times daily as needed for anxiety (sleep). 60 tablet 1  . [START ON 11/25/2020] FLUoxetine (PROZAC) 20 MG tablet TAKE 1 TABLET(20 MG) BY MOUTH DAILY 90 tablet 0  . METHOTREXATE, ANTI-RHEUMATIC, PO Take by mouth once a week.     No current facility-administered medications for this visit.      Psychiatric Specialty Exam: Review of Systems  Psychiatric/Behavioral: Positive for sleep disturbance. The patient is nervous/anxious.   All other systems reviewed and are negative.   There were no vitals taken for this visit.There is no height or weight on file to calculate BMI.  General Appearance: Casual and Well Groomed  Eye Contact:  Good  Speech:  Clear and Coherent  Volume:  Normal  Mood:  Anxious  Affect:  Full Range  Thought Process:  Goal Directed  Orientation:  Full (Time, Place, and Person)  Thought Content: Rumination   Suicidal Thoughts:  No  Homicidal Thoughts:  No  Memory:  Immediate;   Good Recent;   Good Remote;   Good  Judgement:  Good  Insight:  Good  Psychomotor Activity:  Normal  Concentration:  Concentration: Good  Recall:  Good  Fund of Knowledge: Good  Language: Good  Akathisia:  Negative  Handed:  Right  AIMS (if indicated): not done  Assets:  Communication Skills Desire for Improvement Financial Resources/Insurance Housing Resilience Social Support Talents/Skills Vocational/Educational  ADL's:  Intact  Cognition: WNL  Sleep:  Fair    Assessment and Plan: 29 yo single WF with generalized anxiety disorder. Overat  least past2 years she has beenexcessivelyworrying, feelingtension, inability to relax which has eventually lead to poor appetite and difficulties with sleep. Anxiety has worsened earlier this year and she started to feel excessively tired and depressed. She denies having panic attacks. She could not identify specific stressors which could have triggered her anxiety but medical problems which she has been dealing with since adolescence (UC, alopecia) appear to play a significant part. She is in counseling and finds it very helpful.We have started fluoxetine earlier this year - she tolerates it well and reported less anxiety. I also added clonazepam prn anxiety which she uses mostly to help with falling asleep and occasionally for anxiety. Barbarann had a laparascopic colectomy on October 25th, she is back to work (lighter work load) and "is dealing with colostomy issues". She will have second surgery in January. More anxious, increased use of clonazepam since surgery but overall her recovery has been uneventful.   Dx: Generalized  anxiety disorder  Plan:We will continue fluoxetine 20 mg daily and clonazepam 0.5 mg prn anxiety/insomnia - I will increase number of tablets to 60 per month at this time.She will continue in counseling and finds it very helpful.Next appointment in 2 months.The plan was discussed with patient who had an opportunity to ask questions and these were all answered. I spend19minutes in videoconferencingwith the patient.   Magdalene Patricia, MD 10/24/2020, 10:19 AM

## 2020-11-04 NOTE — Unmapped (Signed)
[facility] Refill [facility]ination Note    Specialty Medication(s) to be Shipped:   Inflammatory Disorders: Helen [facility]    Other medication(s) to be shipped: No additional medications requested for fill at this time     Helen [facility], DOB: 02-Oct-1991  Phone: [phone] (home)       All above HIPAA information was verified with patient.     Was a Nurse, learning disability used for this call? No    Completed refill call assessment today to schedule patient's medication shipment from the [facility] [phone]).       Specialty medication(s) and dose(s) confirmed: Regimen is correct and unchanged.   Changes to medications: Helen [facility] reports no changes at this time.  Changes to insurance: No  Questions for the pharmacist: No    Confirmed patient received Welcome Packet with first shipment. The patient will receive a drug information handout for each medication shipped and additional FDA Medication Guides as required.       DISEASE/MEDICATION-SPECIFIC INFORMATION        N/A    SPECIALTY MEDICATION ADHERENCE     Medication Adherence    Patient reported X missed doses in the last month: 0  Specialty Medication: Helen [facility]  Patient is on additional specialty medications: No                Xeljanz 5 mg: 7 days of medicine on hand          SHIPPING     Shipping address confirmed in Epic.     Delivery Scheduled: Yes, Expected medication delivery date: 11/06/20.     Medication will be delivered via UPS to the prescription address in Epic WAM.    Helen Calhoun   [facility] Specialty Technician

## 2020-11-04 NOTE — Unmapped (Signed)
[facility] has made a third and final attempt to reach this patient to refill the following medication: Xeljanz 5mg .      We have left voicemails on the following phone numbers: [phone] (H).    Dates contacted: 11/18, 11/22 & 11/29    Last scheduled delivery: 09/30/2020    The patient may be at risk of non-compliance with this medication. The patient should call the [facility] at [phone] (option 4) to refill medication.    Shadavia Dampier Leodis Binet   [facility] Specialty Technician

## 2020-11-05 MED FILL — XELJANZ 5 MG TABLET: ORAL | 30 days supply | Qty: 60 | Fill #6

## 2020-11-05 MED FILL — XELJANZ 5 MG TABLET: 30 days supply | Qty: 60 | Fill #6 | Status: AC

## 2020-11-05 NOTE — Unmapped (Signed)
Message received from [facility]  [facility] has made a third and final attempt to reach this patient to refill the following medication: Xeljanz 5mg .    ??  We have left voicemails on the following phone numbers: [phone] (H).  ??  Dates contacted: 11/18, 11/22 & 11/29  ??  Last scheduled delivery: 09/30/2020  ??  The patient may be at risk of non-compliance with this medication. The patient should call the [facility] at [phone] (option 4) to refill medication.  ??  Bhakti Leodis Binet   [facility] Specialty Technician    *Patient contact attempted, voicemail left regarding the above.  DETAILED portal message submitted to patient as well at this time.     Patient provided with my return contact information. Call back requested for any questions or concerns

## 2020-11-25 ENCOUNTER — Other Ambulatory Visit (HOSPITAL_COMMUNITY): Payer: Self-pay | Admitting: Psychiatry

## 2020-11-27 NOTE — Unmapped (Signed)
[facility] Clinical Assessment & Refill Coordination Note    Helen [facility], DOB: Apr 07, 1991  Phone: [phone] (home)     All above HIPAA information was verified with patient.     Was a Nurse, learning disability used for this call? No    Specialty Medication(s):   Inflammatory Disorders: Helen Calhoun     Current Outpatient Medications   Medication Sig Dispense Refill   ??? clonazePAM (KLONOPIN) 0.5 MG tablet Take 0.5 mg by mouth.     ??? FLUoxetine (PROZAC) 20 MG tablet Take by mouth.     ??? loratadine (CLARITIN) 10 mg tablet Take 10 mg by mouth daily as needed.  (Patient not taking: Reported on 04/16/2020)     ??? MICROGESTIN FE 1/20, 28, 1 mg-20 mcg (21)/75 mg (7) per tablet Take 1 tablet by mouth daily. 84 tablet 4   ??? tofacitinib (XELJANZ) 5 mg Tab tablet Take 1 tablet (5 mg total) by mouth Two (2) times a day. 180 tablet 3   ??? tofacitinib (XELJANZ) 5 mg Tab tablet Take 1 tablet (5 mg total) by mouth Two (2) times a day. 180 tablet 3     No current facility-administered medications for this visit.        Changes to medications: [name] reports no changes at this time.    No Known Allergies    Changes to allergies: No    SPECIALTY MEDICATION ADHERENCE     Xeljanz 5 mg: 7-14 days of medicine on hand       Medication Adherence    Patient reported X missed doses in the last month: 2  Specialty Medication: Helen [facility] 5mg    Patient is on additional specialty medications: No  Informant: patient          Specialty medication(s) dose(s) confirmed: Regimen is correct and unchanged.     Are there any concerns with adherence? No    Adherence counseling provided? Not needed    CLINICAL MANAGEMENT AND INTERVENTION      Clinical Benefit Assessment:    Do you feel the medicine is effective or helping your condition? Yes    Clinical Benefit counseling provided? Not needed    Adverse Effects Assessment:    Are you experiencing any side effects? No    Are you experiencing difficulty administering your medicine? No    Quality of Life Assessment:    How many days over the past month did your ulcerative pancolitis  keep you from your normal activities? For example, brushing your teeth or getting up in the morning. 0    Have you discussed this with your provider? Not needed    Therapy Appropriateness:    Is therapy appropriate? Yes, therapy is appropriate and should be continued    DISEASE/MEDICATION-SPECIFIC INFORMATION      N/A    PATIENT SPECIFIC NEEDS     - Does the patient have any physical, cognitive, or cultural barriers? No    - Is the patient high risk? No    - Does the patient require a Care Management Plan? No     - Does the patient require physician intervention or other additional services (i.e. nutrition, smoking cessation, social work)? No      SHIPPING     Specialty Medication(s) to be Shipped:   Inflammatory Disorders: Helen [facility]houn    Other medication(s) to be shipped: No additional medications requested for fill at this time     Changes to insurance: No    Delivery Scheduled: Yes, Expected medication  delivery date: 1/5.     Medication will be delivered via UPS to the confirmed prescription address in [facility].    The patient will receive a drug information handout for each medication shipped and additional FDA Medication Guides as required.  Verified that patient has previously received a Conservation officer, historic buildings.    All of the patient's questions and concerns have been addressed.    [name]   [facility] Specialty Pharmacist

## 2020-12-10 MED FILL — XELJANZ 5 MG TABLET: ORAL | 30 days supply | Qty: 60 | Fill #7

## 2020-12-10 MED FILL — XELJANZ 5 MG TABLET: 30 days supply | Qty: 60 | Fill #7 | Status: AC

## 2020-12-27 ENCOUNTER — Other Ambulatory Visit: Payer: Self-pay

## 2020-12-27 ENCOUNTER — Telehealth (INDEPENDENT_AMBULATORY_CARE_PROVIDER_SITE_OTHER): Payer: BC Managed Care – PPO | Admitting: Psychiatry

## 2020-12-27 DIAGNOSIS — F909 Attention-deficit hyperactivity disorder, unspecified type: Secondary | ICD-10-CM

## 2020-12-27 DIAGNOSIS — F411 Generalized anxiety disorder: Secondary | ICD-10-CM

## 2020-12-27 MED ORDER — AMPHETAMINE-DEXTROAMPHETAMINE 10 MG PO TABS: 10.0000 mg | ORAL_TABLET | Freq: Two times a day (BID) | ORAL | 0 refills | Status: DC

## 2020-12-27 NOTE — Progress Notes (Signed)
BH MD/PA/NP OP Progress Note  12/27/2020 10:28 AM Angela Little  MRN:  [id number] Interview was conducted using videoconferencing application and I verified that I was speaking with the correct person using two identifiers. I discussed the limitations of evaluation and management by telemedicine and  the availability of in person appointments. Patient expressed understanding and agreed to proceed. Participants in the visit: patient (location - home); physician (location - home office).  Chief Complaint: Lack of focus.  HPI: 29yo single WF with generalized anxiety disorder and likely mild ADD. Overat least past2 years she has beenexcessivelyworrying, feelingtension, inability to relax which has eventually lead to poor appetite and difficulties with sleep. Anxiety has worsened earlier this year and she started to feel excessively tired and depressed. She denies having panic attacks. She could not identify specific stressors which could have triggered her anxiety but medical problems which she has been dealing with since adolescence (UC, alopecia) appear to play a significant part. She is in counseling and finds it very helpful.We have started fluoxetineearlier this year - she tolerates it well and reported less anxiety. I also added clonazepam prn anxiety which sheuses mostly to help with falling asleep and occasionally for anxiety. Angela Little had a laparascopic colectomy on October 25th, she is back to work (lighter work load) and "is dealing with colostomy issues". She will have second surgery in some time in March. More anxious, increased problems with focusing, sustaining attention at work, procrastination. These have been present in the past (bad in college at one time). She has never been formally diagnosed with ADD but is likely to have it. These problems make her quite nervous that she will be failing at work at this time.    Visit Diagnosis:    ICD-10-CM   1. GAD (generalized  anxiety disorder)  F41.1   2. Adult ADHD  F90.9     Past Psychiatric History: Please see intake H&P.  Past Medical History:  Past Medical History:  Diagnosis Date  . Alopecia   . Crohn's disease (HCC)   . Mononucleosis 02/19/2012   History 2012  . Pharyngitis 02/19/2012   No past surgical history on file.  Family Psychiatric History: Reviewed.  Family History:  Family History  Problem Relation Age of Onset  . Arthritis Other   . Cancer Other        colon cancer  . Hyperlipidemia Other   . Hypertension Other   . Alcohol abuse Cousin     Social History:  Social History   Socioeconomic History  . Marital status: Single    Spouse name: Not on file  . Number of children: Not on file  . Years of education: Not on file  . Highest education level: Not on file  Occupational History  . Occupation: Social worker  Tobacco Use  . Smoking status: Never Smoker  . Smokeless tobacco: Never Used  Vaping Use  . Vaping Use: Never used  Substance and Sexual Activity  . Alcohol use: No  . Drug use: No  . Sexual activity: Not on file  Other Topics Concern  . Not on file  Social History Narrative  . Not on file   Social Determinants of Health   Financial Resource Strain: Not on file  Food Insecurity: Not on file  Transportation Needs: Not on file  Physical Activity: Not on file  Stress: Not on file  Social Connections: Not on file    Allergies: No Known Allergies  Metabolic Disorder Labs: No results found  for: HGBA1C, MPG No results found for: PROLACTIN No results found for: CHOL, TRIG, HDL, CHOLHDL, VLDL, LDLCALC No results found for: TSH  Therapeutic Level Labs: No results found for: LITHIUM No results found for: VALPROATE No components found for:  CBMZ  Current Medications: Current Outpatient Medications  Medication Sig Dispense Refill  . amphetamine-dextroamphetamine (ADDERALL) 10 MG tablet Take 1 tablet (10 mg total) by mouth 2 (two) times daily with  a meal. 60 tablet 0  . [START ON 01/26/2021] amphetamine-dextroamphetamine (ADDERALL) 10 MG tablet Take 1 tablet (10 mg total) by mouth 2 (two) times daily with a meal. 60 tablet 0  . [START ON 02/25/2021] amphetamine-dextroamphetamine (ADDERALL) 10 MG tablet Take 1 tablet (10 mg total) by mouth 2 (two) times daily with a meal. 60 tablet 0  . Adalimumab (HUMIRA PEN [place]) Inject into the skin once a week.    . clonazePAM (KLONOPIN) 0.5 MG tablet Take 1 tablet (0.5 mg total) by mouth 2 (two) times daily as needed for anxiety (sleep). 60 tablet 1  . FLUoxetine (PROZAC) 20 MG tablet TAKE 1 TABLET(20 MG) BY MOUTH DAILY 90 tablet 0  . METHOTREXATE, ANTI-RHEUMATIC, PO Take by mouth once a week.     No current facility-administered medications for this visit.      Psychiatric Specialty Exam: Review of Systems  Psychiatric/Behavioral: Positive for decreased concentration. The patient is nervous/anxious.   All other systems reviewed and are negative.   There were no vitals taken for this visit.There is no height or weight on file to calculate BMI.  General Appearance: Casual  Eye Contact:  Good  Speech:  Clear and Coherent and Normal Rate  Volume:  Normal  Mood:  Anxious  Affect:  Congruent  Thought Process:  Goal Directed  Orientation:  Full (Time, Place, and Person)  Thought Content: Rumination   Suicidal Thoughts:  No  Homicidal Thoughts:  No  Memory:  Immediate;   Good Recent;   Good Remote;   Good  Judgement:  Good  Insight:  Good  Psychomotor Activity:  Normal  Concentration:  Concentration: Fair  Recall:  Good  Fund of Knowledge: Good  Language: Good  Akathisia:  Negative  Handed:  Right  AIMS (if indicated): not done  Assets:  Communication Skills Desire for Improvement Financial Resources/Insurance Housing Resilience Social Support Vocational/Educational  ADL's:  Intact  Cognition: WNL  Sleep:  Fair     Assessment and Plan: 29yo single WF with generalized anxiety  disorder and likely mild ADD. Overat least past2 years she has beenexcessivelyworrying, feelingtension, inability to relax which has eventually lead to poor appetite and difficulties with sleep. Anxiety has worsened earlier this year and she started to feel excessively tired and depressed. She denies having panic attacks. She could not identify specific stressors which could have triggered her anxiety but medical problems which she has been dealing with since adolescence (UC, alopecia) appear to play a significant part. She is in counseling and finds it very helpful.We have started fluoxetineearlier this year - she tolerates it well and reported less anxiety. I also added clonazepam prn anxiety which sheuses mostly to help with falling asleep and occasionally for anxiety. Angela Little had a laparascopic colectomy on October 25th, she is back to work (lighter work load) and "is dealing with colostomy issues". She will have second surgery in some time in March. More anxious, increased problems with focusing, sustaining attention at work, procrastination. These have been present in the past (bad in college at one time).  She has never been formally diagnosed with ADD but is likely to have it. These problems make her quite nervous that she will be failing at work at this time.   Dx: Generalized anxiety disorder; likely Adult ADHD (inattentive)  Plan:We will continue fluoxetine 20 mg daily and clonazepam 0.5 mg prn anxiety/insomnia. We will try addition of Adderall IR 10 mg bid for ADD symptoms. Next appointment in2 months.The plan was discussed with patient who had an opportunity to ask questions and these were all answered. I spend81minutes in videoconferencingwith the patient.   Magdalene Patricia, MD 12/27/2020, 10:28 AM

## 2021-01-08 NOTE — Unmapped (Signed)
The [facility] has made a second and final attempt to reach this patient to refill the following medication:  Xeljanz 5mg .      We have left voicemails on the following phone numbers: [phone] (H) and have sent a MyChart message.    Dates contacted: 01/27 & 02/02    Last scheduled delivery: 12/10/20    The patient may be at risk of non-compliance with this medication. The patient should call the [facility] at [phone] (option 4) to refill medication.    Aixa Corsello Leodis Binet   [facility] Specialty Technician

## 2021-01-16 NOTE — Unmapped (Signed)
-----   Message from Dion Body sent at 01/10/2021  3:43 PM EST -----  Regarding: RE: apt with Dr. Jacqulyn Bath  I left a message and placed a recall.  ----- Message -----  From: Dorisann Frames, RN  Sent: 01/10/2021   3:30 PM EST  To: Reita Ch[facility] Appts  Subject: apt with Dr. Jacob Moores afternoon  This patient needs post-op clinic visit with Dr. Jacqulyn Bath to review her plan of care and medications.  Can you please contact her and offer a held 12 or 1230 slot in the coming weeks?    Thank you!  Inetta Fermo

## 2021-01-16 NOTE — Unmapped (Signed)
Contact attempted by phone and portal message now to offer and help arrange return clinic visit in timely manner as part of her post-op care plan.    No response received at this time, patient provided with GIS and IBD RN contact for scheduling when able.

## 2021-01-17 NOTE — Unmapped (Signed)
[facility] Refill Coordination Note    Specialty Medication(s) to be Shipped:   Inflammatory Disorders: Helen Calhoun    Other medication(s) to be shipped: No additional medications requested for fill at this time     Helen Calhoun, DOB: 12/27/1990  Phone: [phone] (home)       All above HIPAA information was verified with patient.     Was a Nurse, learning disability used for this call? No    Completed refill call assessment today to schedule patient's medication shipment from the [facility] [phone]).       Specialty medication(s) and dose(s) confirmed: Regimen is correct and unchanged.   Changes to medications: Helen Calhoun reports no changes at this time.  Changes to insurance: No  Questions for the pharmacist: No    Confirmed patient received Welcome Packet with first shipment. The patient will receive a drug information handout for each medication shipped and additional FDA Medication Guides as required.       DISEASE/MEDICATION-SPECIFIC INFORMATION        N/A    SPECIALTY MEDICATION ADHERENCE     Medication Adherence    Patient reported X missed doses in the last month: 0              XELJANZ  7 days worth of medication on hand.        SHIPPING     Shipping address confirmed in Epic.     Delivery Scheduled: Yes, Expected medication delivery date: 01/21/21.     Medication will be delivered via UPS to the prescription address in Epic WAM.    Helen Calhoun   [facility] Specialty Technician

## 2021-01-20 MED FILL — XELJANZ 5 MG TABLET: ORAL | 30 days supply | Qty: 60 | Fill #8

## 2021-02-13 NOTE — Unmapped (Signed)
[facility] Refill Coordination Note    Specialty Medication(s) to be Shipped:   Inflammatory Disorders: [name] 5mg   Other medication(s) to be shipped: No additional medications requested for fill at this time     Helen Calhoun, DOB: 15-Dec-1990  Phone: [phone] (home)     All above HIPAA information was verified with patient.     Was a Nurse, learning disability used for this call? No    Completed refill call assessment today to schedule patient's medication shipment from the [facility] [phone]).       Specialty medication(s) and dose(s) confirmed: Regimen is correct and unchanged.   Changes to medications: Helen Calhoun reports no changes at this time.  Changes to insurance: No  Questions for the pharmacist: No    Confirmed patient received Welcome Packet with first shipment. The patient will receive a drug information handout for each medication shipped and additional FDA Medication Guides as required.       DISEASE/MEDICATION-SPECIFIC INFORMATION        N/A    SPECIALTY MEDICATION ADHERENCE     Medication Adherence    Patient reported X missed doses in the last month: 0  Specialty Medication: [name] 5mg   Patient is on additional specialty medications: No  Patient is on more than two specialty medications: No  Informant: patient  Reliability of informant: reliable        [name] 5mg : 7 days of medicine on hand     SHIPPING     Shipping address confirmed in Epic.     Delivery Scheduled: Yes, Expected medication delivery date: 02/18/2021.     Medication will be delivered via UPS to the prescription address in Epic WAM.    Helen Calhoun Shared [facility] Specialty Technician

## 2021-02-14 ENCOUNTER — Telehealth (INDEPENDENT_AMBULATORY_CARE_PROVIDER_SITE_OTHER): Payer: BC Managed Care – PPO | Admitting: Psychiatry

## 2021-02-14 ENCOUNTER — Other Ambulatory Visit: Payer: Self-pay

## 2021-02-14 DIAGNOSIS — F411 Generalized anxiety disorder: Secondary | ICD-10-CM

## 2021-02-14 DIAGNOSIS — F909 Attention-deficit hyperactivity disorder, unspecified type: Secondary | ICD-10-CM | POA: Diagnosis not present

## 2021-02-14 MED ORDER — CLONAZEPAM 0.5 MG PO TABS
0.5000 mg | ORAL_TABLET | Freq: Two times a day (BID) | ORAL | 1 refills | Status: DC | PRN
Start: 2021-02-14 — End: 2022-08-14

## 2021-02-14 MED ORDER — AMPHETAMINE-DEXTROAMPHETAMINE 10 MG PO TABS: 10.0000 mg | ORAL_TABLET | Freq: Two times a day (BID) | ORAL | 0 refills | Status: DC

## 2021-02-14 MED ORDER — FLUOXETINE HCL 20 MG PO TABS: 20.0000 mg | ORAL_TABLET | Freq: Every day | ORAL | 0 refills | Status: DC

## 2021-02-14 NOTE — Progress Notes (Signed)
BH MD/PA/NP OP Progress Note  02/14/2021 9:50 AM Daphney Hopke  MRN:  [id number] Interview was conducted using videoconferencing application and I verified that I was speaking with the correct person using two identifiers. I discussed the limitations of evaluation and management by telemedicine and  the availability of in person appointments. Patient expressed understanding and agreed to proceed. Participants in the visit: patient (location - home); physician (location - home office).  Chief Complaint: None.  HPI: 30yo single WF with generalized anxiety disorder and likely mild ADD. Overat least past2 years she has beenexcessivelyworrying, feelingtension, inability to relax which has eventually lead to poor appetite and difficulties with sleep. Anxiety has worsenedlast yearand she started to feel excessively tired and depressed. She denies having panic attacks. She could notidentify specific stressors which could have triggered her anxiety but medical problems which she has been dealing with since adolescence (Crohn's, alopecia) appear to play a significant part. Sheisin counselingand finds it very helpful.We have started fluoxetinelast year- she tolerates it well and reportedless anxiety. I also added clonazepam prn anxiety which sheuses mostly to help with falling asleep and occasionally for anxiety. Hannahhad a laparascopic colectomyon October 25th, she is back to work (lighter work load) and "is dealing with colostomy issues". She will have second surgery next week. She reported increased problems with focusing, sustaining attention at work, procrastination. These have been present in the past (bad in college at one time). She has never been formally diagnosed with ADD but is likely to have it. These problems make her quite nervous that she will be failing at work at this time. We have added Adderall 10 mg bid in January and these problems resolved. Her need for clonazepam also  markedly decreased.     Visit Diagnosis:    ICD-10-CM   1. Adult ADHD  F90.9   2. GAD (generalized anxiety disorder)  F41.1     Past Psychiatric History: Please see intake H&P.  Past Medical History:  Past Medical History:  Diagnosis Date  . Alopecia   . Crohn's disease (HCC)   . Mononucleosis 02/19/2012   History 2012  . Pharyngitis 02/19/2012   No past surgical history on file.  Family Psychiatric History: Reviewed.  Family History:  Family History  Problem Relation Age of Onset  . Arthritis Other   . Cancer Other        colon cancer  . Hyperlipidemia Other   . Hypertension Other   . Alcohol abuse Cousin     Social History:  Social History   Socioeconomic History  . Marital status: Single    Spouse name: Not on file  . Number of children: Not on file  . Years of education: Not on file  . Highest education level: Not on file  Occupational History  . Occupation: Social worker  Tobacco Use  . Smoking status: Never Smoker  . Smokeless tobacco: Never Used  Vaping Use  . Vaping Use: Never used  Substance and Sexual Activity  . Alcohol use: No  . Drug use: No  . Sexual activity: Not on file  Other Topics Concern  . Not on file  Social History Narrative  . Not on file   Social Determinants of Health   Financial Resource Strain: Not on file  Food Insecurity: Not on file  Transportation Needs: Not on file  Physical Activity: Not on file  Stress: Not on file  Social Connections: Not on file    Allergies: No Known Allergies  Metabolic Disorder  Labs: No results found for: HGBA1C, MPG No results found for: PROLACTIN No results found for: CHOL, TRIG, HDL, CHOLHDL, VLDL, LDLCALC No results found for: TSH  Therapeutic Level Labs: No results found for: LITHIUM No results found for: VALPROATE No components found for:  CBMZ  Current Medications: Current Outpatient Medications  Medication Sig Dispense Refill  . Adalimumab (HUMIRA PEN [place])  Inject into the skin once a week.    Melene Muller ON 02/25/2021] amphetamine-dextroamphetamine (ADDERALL) 10 MG tablet Take 1 tablet (10 mg total) by mouth 2 (two) times daily with a meal. 60 tablet 0  . [START ON 04/27/2021] amphetamine-dextroamphetamine (ADDERALL) 10 MG tablet Take 1 tablet (10 mg total) by mouth 2 (two) times daily with a meal. 60 tablet 0  . [START ON 03/28/2021] amphetamine-dextroamphetamine (ADDERALL) 10 MG tablet Take 1 tablet (10 mg total) by mouth 2 (two) times daily with a meal. 60 tablet 0  . clonazePAM (KLONOPIN) 0.5 MG tablet Take 1 tablet (0.5 mg total) by mouth 2 (two) times daily as needed for anxiety (sleep). 60 tablet 1  . FLUoxetine (PROZAC) 20 MG tablet Take 1 tablet (20 mg total) by mouth daily. 90 tablet 0  . METHOTREXATE, ANTI-RHEUMATIC, PO Take by mouth once a week.     No current facility-administered medications for this visit.     Psychiatric Specialty Exam: Review of Systems  All other systems reviewed and are negative.   There were no vitals taken for this visit.There is no height or weight on file to calculate BMI.  General Appearance: Casual  Eye Contact:  Good  Speech:  Clear and Coherent and Normal Rate  Volume:  Normal  Mood:  Euthymic  Affect:  Full Range  Thought Process:  Goal Directed and Linear  Orientation:  Full (Time, Place, and Person)  Thought Content: Logical   Suicidal Thoughts:  No  Homicidal Thoughts:  No  Memory:  Immediate;   Good Recent;   Good Remote;   Good  Judgement:  Good  Insight:  Good  Psychomotor Activity:  Normal  Concentration:  Concentration: Good  Recall:  Good  Fund of Knowledge: Good  Language: Good  Akathisia:  Negative  Handed:  Right  AIMS (if indicated): not done  Assets:  Communication Skills Desire for Improvement Financial Resources/Insurance Housing Resilience Social Support Vocational/Educational  ADL's:  Intact  Cognition: WNL  Sleep:  Fair    Assessment and Plan:  29yo single  WF with generalized anxiety disorder and likely mild ADD. Overat least past2 years she has beenexcessivelyworrying, feelingtension, inability to relax which has eventually lead to poor appetite and difficulties with sleep. Anxiety has worsenedlast yearand she started to feel excessively tired and depressed. She denies having panic attacks. She could notidentify specific stressors which could have triggered her anxiety but medical problems which she has been dealing with since adolescence (Crohn's, alopecia) appear to play a significant part. Sheisin counselingand finds it very helpful.We have started fluoxetinelast year- she tolerates it well and reportedless anxiety. I also added clonazepam prn anxiety which sheuses mostly to help with falling asleep and occasionally for anxiety. Hannahhad a laparascopic colectomyon October 25th, she is back to work (lighter work load) and "is dealing with colostomy issues". She will have second surgery next week. She reported increased problems with focusing, sustaining attention at work, procrastination. These have been present in the past (bad in college at one time). She has never been formally diagnosed with ADD but is likely to have it. These  problems make her quite nervous that she will be failing at work at this time. We have added Adderall 10 mg bid in January and these problems resolved. Her need for clonazepam also markedly decreased.   Dx: Generalized anxiety disorder; Adult ADHD (inattentive)  Plan:We will continue fluoxetine 20 mg daily, clonazepam 0.5 mg prn anxiety/insomnia and Adderall IR 10 mg bid for ADD symptoms. Next appointment in3 months with a new provider. Sjhe now lives in [place] but would like to stay with our practice if she can use video visits.The plan was discussed with patient who had an opportunity to ask questions and these were all answered. I spend70minutes in videoconferencingwith the patient.    Magdalene Patricia, MD 02/14/2021, 9:50 AM

## 2021-02-17 MED FILL — XELJANZ 5 MG TABLET: ORAL | 30 days supply | Qty: 60 | Fill #9

## 2021-03-17 NOTE — Unmapped (Signed)
The [facility] has made a second and final attempt to reach this patient to refill the following medication:  Xeljanz 5mg .      We have left voicemails on the following phone numbers: [phone] (H) and have sent a MyChart message.    Dates contacted: 04/07 & 04/11    Last scheduled delivery: 02/17/2021    The patient may be at risk of non-compliance with this medication. The patient should call the [facility] at [phone] (option 4) to refill medication.    Jaquita Bessire Leodis Binet   [facility] Shared [facility] Specialty Technician

## 2021-03-31 NOTE — Unmapped (Signed)
[facility] Refill Coordination Note    Specialty Medication(s) to be Shipped:   Inflammatory Disorders: [name]    Other medication(s) to be shipped: No additional medications requested for fill at this time     [name], DOB: 10-02-1991  Phone: [phone] (home)       All above HIPAA information was verified with patient.     Was a Nurse, learning disability used for this call? No    Completed refill call assessment today to schedule patient's medication shipment from the [facility] [phone]).  All relevant notes have been reviewed.     Specialty medication(s) and dose(s) confirmed: Regimen is correct and unchanged.   Changes to medications: [name] reports no changes at this time.  Changes to insurance: No  New side effects reported not previously addressed with a pharmacist or physician: None reported  Questions for the pharmacist: No    Confirmed patient received a Conservation officer, historic buildings and a Surveyor, mining with first shipment. The patient will receive a drug information handout for each medication shipped and additional FDA Medication Guides as required.       DISEASE/MEDICATION-SPECIFIC INFORMATION        N/A    SPECIALTY MEDICATION ADHERENCE     Medication Adherence    Patient reported X missed doses in the last month: 0  Specialty Medication: [name] 5mg   Patient is on additional specialty medications: No  Informant: patient              Were doses missed due to medication being on hold? No    Xeljanz 5 mg: 7 days of medicine on hand       REFERRAL TO PHARMACIST     Referral to the pharmacist: Not needed      [facility]     Shipping address confirmed in Epic.     Delivery Scheduled: Yes, Expected medication delivery date: 04/03/21.     Medication will be delivered via UPS to the prescription address in Epic WAM.    Jasper Loser   [facility] Specialty Technician

## 2021-04-02 MED FILL — XELJANZ 5 MG TABLET: ORAL | 30 days supply | Qty: 60 | Fill #10

## 2021-04-24 DIAGNOSIS — K5100 Ulcerative (chronic) pancolitis without complications: Principal | ICD-10-CM

## 2021-04-24 MED ORDER — XELJANZ 5 MG TABLET
ORAL_TABLET | Freq: Two times a day (BID) | ORAL | 3 refills | 90.00000 days | Status: CP
Start: 2021-04-24 — End: ?
  Filled 2021-05-14: qty 60, 30d supply, fill #0

## 2021-04-28 NOTE — Unmapped (Signed)
The [facility] has made a third and final attempt to reach this patient to refill the following medication:Xeljanz 5 MG Tab.      We have left voicemails on the following phone numbers: [phone] and have sent a MyChart message.    Dates contacted: 5/18, 5/19, 5/23  Last scheduled delivery: 04/02/21    The patient may be at risk of non-compliance with this medication. The patient should call the [facility] at [phone] (option 4) to refill medication.    [name]   [facility] Specialty Technician

## 2021-04-29 NOTE — Unmapped (Signed)
-----   Message from Marlou Starks sent at 04/28/2021 10:00 AM EDT -----  Hi - I left a vm for the Helen Calhoun & entered a recall.     Thanks,  Chyrel Masson  ----- Message -----  From: Dorisann Frames, RN  Sent: 04/28/2021   9:20 AM EDT  To: Reita Chard [facility]    Good morning!  This patient is due for annual clinic visit with Dr. Jacqulyn Bath in person  Last visit was in 07/2020    This is required for continued care and medication management. Could you contact them and advise this and assist with scheduling the visit?    Thank you!  Inetta Fermo

## 2021-05-13 NOTE — Unmapped (Signed)
[facility] Clinical Assessment & Refill Coordination Note    Helen Calhoun, DOB: 03/17/1991  Phone: [phone] (home)     All above HIPAA information was verified with patient.     Was a Nurse, learning disability used for this call? No    Specialty Medication(s):   Inflammatory Disorders: [name]     Current Outpatient Medications   Medication Sig Dispense Refill   ??? clonazePAM (KLONOPIN) 0.5 MG tablet Take 0.5 mg by mouth.     ??? FLUoxetine (PROZAC) 20 MG tablet Take by mouth.     ??? loratadine (CLARITIN) 10 mg tablet Take 10 mg by mouth daily as needed.  (Patient not taking: Reported on 04/16/2020)     ??? MICROGESTIN FE 1/20, 28, 1 mg-20 mcg (21)/75 mg (7) per tablet Take 1 tablet by mouth daily. 84 tablet 4   ??? tofacitinib (XELJANZ) 5 mg Tab tablet Take 1 tablet (5 mg total) by mouth Two (2) times a day. (Patient not taking: Reported on 12/09/2020) 180 tablet 3   ??? tofacitinib (XELJANZ) 5 mg Tab tablet Take 1 tablet (5 mg total) by mouth Two (2) times a day. 180 tablet 3     No current facility-administered medications for this visit.        Changes to medications: Helen Calhoun reports no changes at this time.    No Known Allergies    Changes to allergies: No    SPECIALTY MEDICATION ADHERENCE     Xeljanz 5 mg: 4 days of medicine on hand       Medication Adherence    Patient reported X missed doses in the last month: 3  Specialty Medication: [name] 5mg   Patient is on additional specialty medications: No  Informant: patient          Specialty medication(s) dose(s) confirmed: Regimen is correct and unchanged.     Are there any concerns with adherence? No    Adherence counseling provided? Not needed    CLINICAL MANAGEMENT AND INTERVENTION      Clinical Benefit Assessment:    Do you feel the medicine is effective or helping your condition? Yes    Clinical Benefit counseling provided? has an appointment in August    Adverse Effects Assessment:    Are you experiencing any side effects? No    Are you experiencing difficulty administering your medicine? No    Quality of Life Assessment:    How many days over the past month did your ulcerative pancolitis  keep you from your normal activities? For example, brushing your teeth or getting up in the morning. 0    Have you discussed this with your provider? Not needed    Acute Infection Status:    Acute infections noted within Epic:  No active infections  Patient reported infection: None    Therapy Appropriateness:    Is therapy appropriate? Yes, therapy is appropriate and should be continued    DISEASE/MEDICATION-SPECIFIC INFORMATION      N/A    PATIENT SPECIFIC NEEDS     - Does the patient have any physical, cognitive, or cultural barriers? No    - Is the patient high risk? No    - Does the patient require a Care Management Plan? No     - Does the patient require physician intervention or other additional services (i.e. nutrition, smoking cessation, social work)? No      SHIPPING     Specialty Medication(s) to be Shipped:   Inflammatory Disorders: [name]  Other medication(s) to be shipped: No additional medications requested for fill at this time     Changes to insurance: No    Delivery Scheduled: Yes, Expected medication delivery date: 6/9.     Medication will be delivered via Next Day Courier to the confirmed prescription address in [facility].    The patient will receive a drug information handout for each medication shipped and additional FDA Medication Guides as required.  Verified that patient has previously received a Conservation officer, historic buildings and a Surveyor, mining.    The patient or caregiver noted above participated in the development of this care plan and knows that they can request review of or adjustments to the care plan at any time.      All of the patient's questions and concerns have been addressed.    [name]   [facility] Specialty Pharmacist

## 2021-06-13 NOTE — Unmapped (Signed)
[facility] Pharm[facility] Refill Coordination Note    Specialty Medication(s) to be Shipped:   Inflammatory Disorders: Helen Calhoun    Other medication(s) to be shipped: No additional medications requested for fill at this time     Helen Calhoun, DOB: January 17, 1991  Phone: [phone] (home)       All above HIPAA information was verified with patient.     Was a Nurse, learning disability used for this call? No    Completed refill call assessment today to schedule patient's medication shipment from the [facility] [phone]).  All relevant notes have been reviewed.     Specialty medication(s) and dose(s) confirmed: Regimen is correct and unchanged.   Changes to medications: Helen Calhoun reports no changes at this time.  Changes to insurance: No  New side effects reported not previously addressed with a pharmacist or physician: None reported  Questions for the pharmacist: No    Confirmed patient received a Conservation officer, historic buildings and a Surveyor, mining with first shipment. The patient will receive a drug information handout for each medication shipped and additional FDA Medication Guides as required.       DISEASE/MEDICATION-SPECIFIC INFORMATION        N/A    SPECIALTY MEDICATION ADHERENCE     Medication Adherence    Patient reported X missed doses in the last month: 0  Specialty Medication: XELJANZ 5 mg  Patient is on additional specialty medications: No  Patient is on more than two specialty medications: No  Any gaps in refill history greater than 2 weeks in the last 3 months: no  Demonstrates understanding of importance of adherence: yes  Informant: patient  Reliability of informant: reliable  Confirmed plan for next specialty medication refill: delivery by pharmacy  Refills needed for supportive medications: not needed              Were doses missed due to medication being on hold? No    XELJANZ 5 mg: 7 days of medicine on hand       REFERRAL TO PHARMACIST     Referral to the pharmacist: Not needed      [facility]     Shipping address confirmed in Epic.     Delivery Scheduled: Yes, Expected medication delivery date: 06/17/21.     Medication will be delivered via UPS to the prescription address in Epic WAM.    Helen Calhoun   [facility] Specialty Technician

## 2021-06-16 MED FILL — XELJANZ 5 MG TABLET: ORAL | 30 days supply | Qty: 60 | Fill #1

## 2021-07-09 IMAGING — MR MR 3D RECON AT SCANNER
18 of 19 series · 45 of 48 positions shown · IV contrast (gadavist)
Comparison: Ultrasound abdomen from [DATE], no additional comparison
imaging is available.

CLINICAL DATA: History of Crohn's disease evaluate for PSC



[Series 3: T2 fat-sat · axial · 6.0mm · 1.09mm/px · z∈[-114,+138]mm · 2 of 36 slices shown]
[im 1/36]
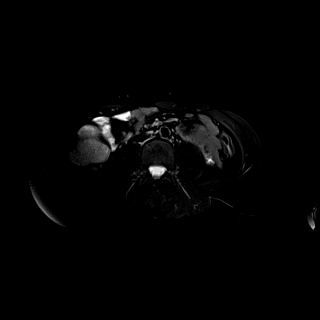
[im 36/36]
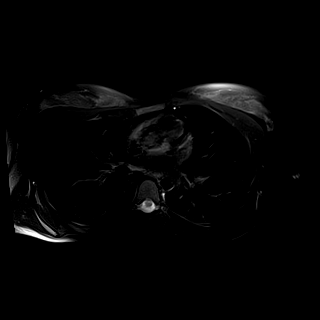

[Series 6: T2 · coronal · 6.0mm · 1.48mm/px · 2 of 26 slices shown (1 of 2)]
[im 1/26]
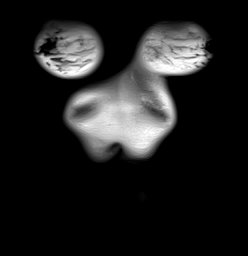
[im 26/26]
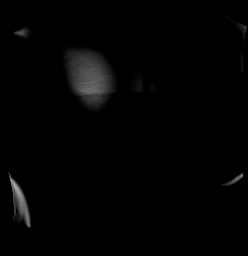

[Series 8: DWI · axial · 6.0mm · 1.36mm/px · z∈[-126,+126]mm · 4 of 72 slices shown (1 of 2)]
[im 1/72]
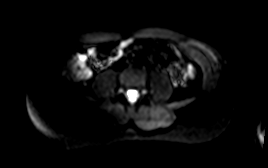
[im 24/72]
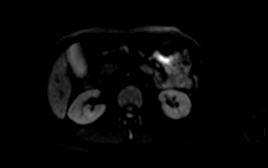
[im 48/72]
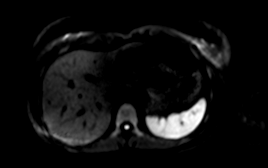
[im 72/72]
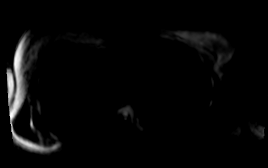

[Series 9: DWI · axial · 6.0mm · 1.36mm/px · 1 of 36 slices shown (2 of 2)]
[im 1/36]
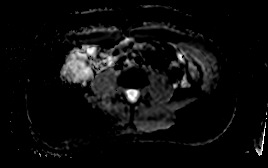

[Series 10: T1 · axial · 3.0mm · 1.12mm/px · z∈[-127,+110]mm · 3 of 80 slices shown (1 of 2)]
[im 1/80]
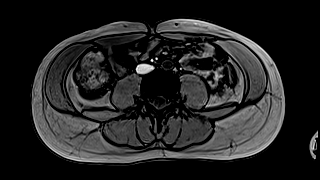
[im 40/80]
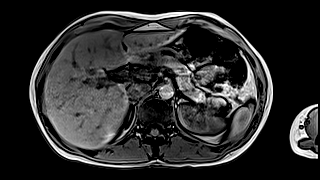
[im 80/80]
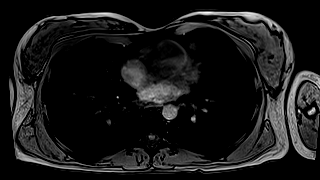

[Series 11: T1 · axial · 3.0mm · 1.12mm/px · z∈[-127,+110]mm · 3 of 80 slices shown (2 of 2)]
[im 1/80]
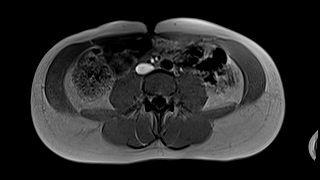
[im 40/80]
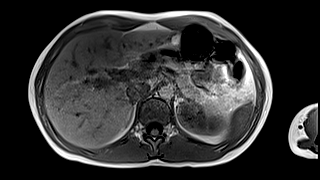
[im 80/80]
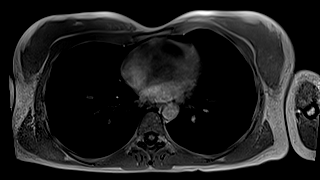

[Series 12: T2 · axial · 6.0mm · 1.41mm/px · 1 of 34 slices shown (2 of 2)]
[im 1/34]
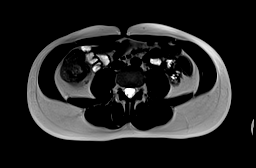

[Series 13: cor obl thk · sagittal · 50.0mm · 0.78mm/px · 1 of 9 slices shown]
[im 1/9]
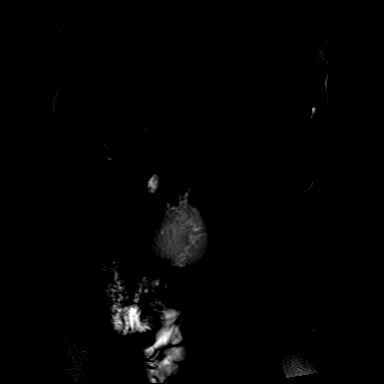

[Series 15: T1 dynamic · axial · 3.0mm · 1.12mm/px · z∈[-144,+117]mm · 3 of 88 slices shown (1 of 6)]
[im 1/88]
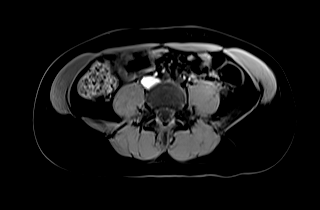
[im 44/88]
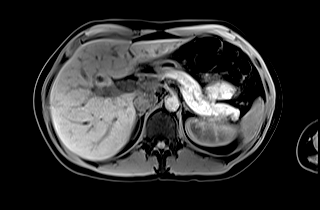
[im 88/88]
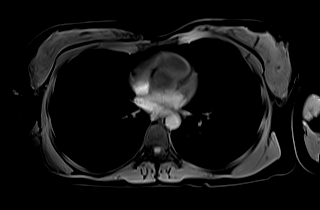

[Series 18: T1 dynamic · axial · 3.0mm · 1.12mm/px · z∈[-144,+117]mm · 3 of 88 slices shown (2 of 6)]
[im 1/88]
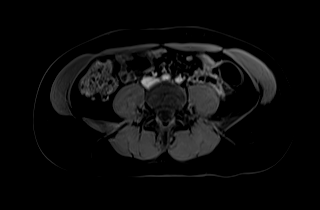
[im 44/88]
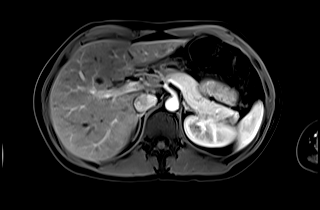
[im 88/88]
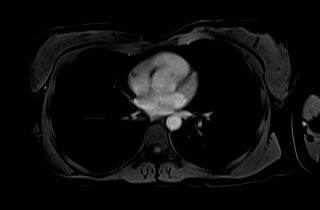

[Series 20: T1 dynamic · axial · 3.0mm · 1.12mm/px · z∈[-144,+117]mm · 3 of 88 slices shown (3 of 6)]
[im 1/88]
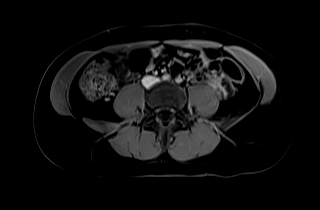
[im 44/88]
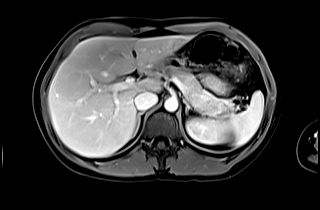
[im 88/88]
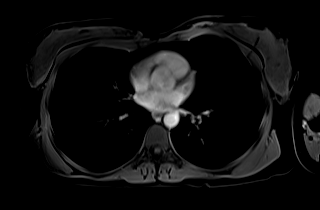

[Series 22: T1 dynamic · axial · 3.0mm · 1.12mm/px · z∈[-144,+117]mm · 3 of 88 slices shown (4 of 6)]
[im 1/88]
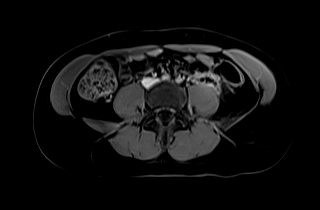
[im 44/88]
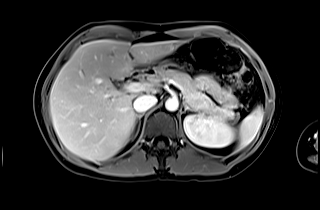
[im 88/88]
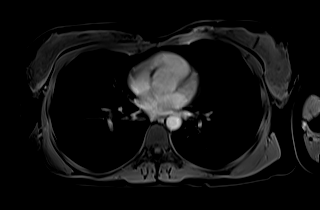

[Series 24: T1 dynamic · coronal · 5.0mm · 1.16mm/px · 2 of 44 slices shown (5 of 6)]
[im 1/44]
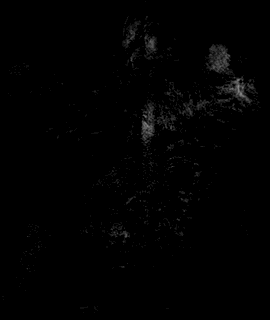
[im 44/44]
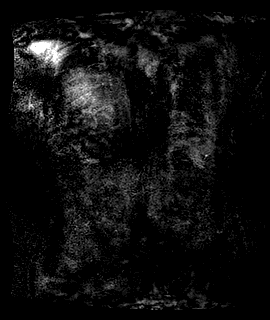

[Series 26: T1 dynamic · axial · 3.0mm · 1.12mm/px · z∈[-144,+117]mm · 3 of 88 slices shown (6 of 6)]
[im 1/88]
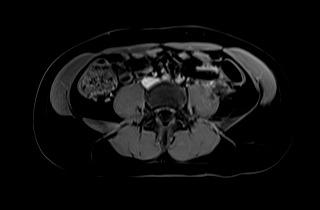
[im 44/88]
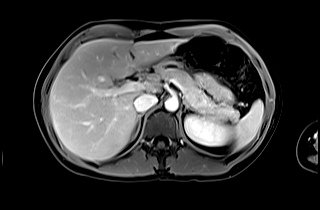
[im 88/88]
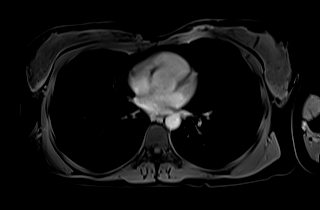

[Series 100: sub_20 sec · axial · 3.0mm · 1.12mm/px · z∈[-144,+117]mm · 3 of 88 slices shown]
[im 1/88]
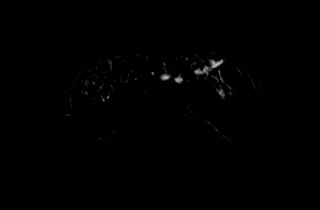
[im 44/88]
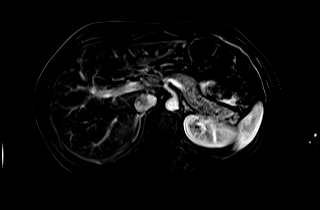
[im 88/88]
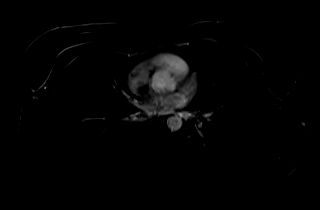

[Series 101: sub_45 sec · axial · 3.0mm · 1.12mm/px · z∈[-144,+117]mm · 3 of 88 slices shown]
[im 1/88]
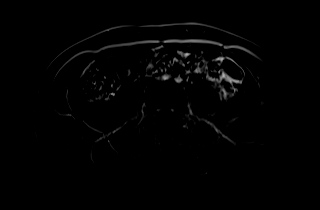
[im 44/88]
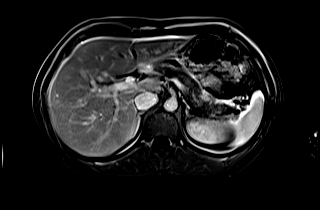
[im 88/88]
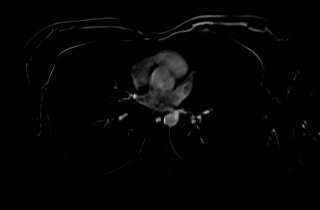

[Series 102: sub 90 sec · axial · 3.0mm · 1.12mm/px · z∈[-144,+117]mm · 3 of 88 slices shown]
[im 1/88]
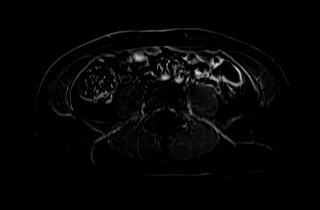
[im 44/88]
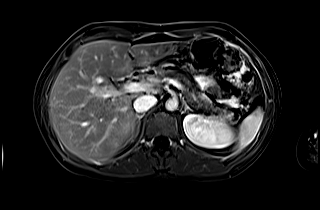
[im 88/88]
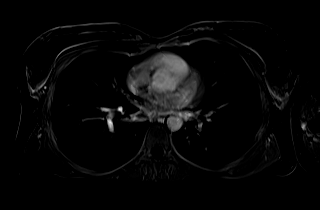

[Series 103: sub_delay · axial · 3.0mm · 1.12mm/px · z∈[-63,+117]mm · 2 of 61 slices shown]
[im 1/61]
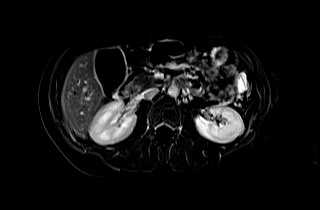
[im 61/61]
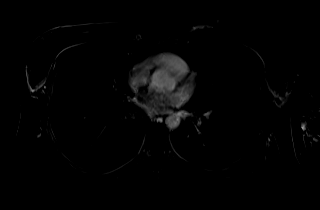

[45 of 48 positions shown; findings below may reference images not displayed]

FINDINGS: Lower chest: No effusion. No consolidation. Limited assessment of
the lung bases on MRI.

Hepatobiliary: MRCP sequences with limited by respiratory variation.
Repeat images were attempted. The thick slab images show biliary
irregularity to best advantage. These 2 however are limited.
Intrahepatic biliary tree can be seen to the periphery with some
irregular beading and narrowing suggested.

No focal, suspicious hepatic lesion. Area of signal abnormality in
the RIGHT hepatic lobe with linear appearance may reflect tract from
previous biopsy performed in 3253

No filling defects in the gallbladder. No pericholecystic stranding.

Pancreas: Pancreas without signs of inflammation, ductal dilation or
lesion.

Spleen:  Spleen normal in size and contour.

Adrenals/Urinary Tract:  Adrenal glands are normal.

Stomach/Bowel: Suggest mild thickening of the ascending colon not
well evaluated, study was not protocol for bowel evaluation. There
is small pericolonic lymph nodes. The descending colon is ahaustral.
No signs of small bowel obstruction. Limited assessment of bowel.
Pelvic bowel loops are not imaged.

Vascular/Lymphatic: No aneurysmal dilation of the abdominal aorta.
No significant atheromatous changes. No adenopathy.

Other:  None.

Musculoskeletal: No suspicious bone lesions identified.
IMPRESSION: 1. Intrahepatic biliary tree can be seen to the periphery with some
irregular beading and narrowing suggested. Findings are suspicious
for primary sclerosing cholangitis as suggested on previous MRI.
Comparison imaging is not available.
2. No focal, suspicious hepatic lesion or sign of dominant
stricture.
3. Suggestion of mild thickening of the ascending colon on coronal
images with relatively ahaustral appearance of the descending colon.
Findings may reflect sequela of inflammatory bowel disease, not well
assessed.

## 2021-07-15 NOTE — Unmapped (Signed)
[facility] Refill Coordination Note    Specialty Medication(s) to be Shipped:   Inflammatory Disorders: [name]    Other medication(s) to be shipped: No additional medications requested for fill at this time     Helen Calhoun, DOB: 01-17-1991  Phone: [phone] (home)       All above HIPAA information was verified with patient.     Was a Nurse, learning disability used for this call? No    Completed refill call assessment today to schedule patient's medication shipment from the [facility] [phone]).  All relevant notes have been reviewed.     Specialty medication(s) and dose(s) confirmed: Regimen is correct and unchanged.   Changes to medications: [name] reports no changes at this time.  Changes to insurance: No  New side effects reported not previously addressed with a pharmacist or physician: None reported  Questions for the pharmacist: No    Confirmed patient received a Conservation officer, historic buildings and a Surveyor, mining with first shipment. The patient will receive a drug information handout for each medication shipped and additional FDA Medication Guides as required.       DISEASE/MEDICATION-SPECIFIC INFORMATION        N/A    SPECIALTY MEDICATION ADHERENCE     Medication Adherence    Patient reported X missed doses in the last month: 0  Specialty Medication: XELJANZ 5 mg  Patient is on additional specialty medications: No              Were doses missed due to medication being on hold? No    xeljanz 5mg   : 8 days of medicine on hand       REFERRAL TO PHARMACIST     Referral to the pharmacist: Not needed      [facility]     Shipping address confirmed in Epic.     Delivery Scheduled: Yes, Expected medication delivery date: 8/11.     Medication will be delivered via Next Day Courier to the prescription address in Epic WAM.    Helen Calhoun   [facility] Specialty Technician

## 2021-07-16 MED FILL — XELJANZ 5 MG TABLET: ORAL | 30 days supply | Qty: 60 | Fill #2

## 2021-08-18 NOTE — Unmapped (Signed)
The [facility] has made a third and final attempt to reach this patient to refill the following medication:XELJANZ 5 mg      We have left voicemails on the following phone numbers: [phone] and have sent a MyChart message.    Dates contacted: 08/06/21-08/12/21-08/18/21  Last scheduled delivery: 07/16/21    The patient may be at risk of non-compliance with this medication. The patient should call the [facility] at [phone]  Option 4, then Option 2 (all other specialty patients) to refill medication.    [name]   [facility] Specialty Technician

## 2021-09-15 NOTE — Unmapped (Signed)
[facility] Pharm[facility] Refill Coordination Note    Specialty Medication(s) to be Shipped:   Inflammatory Disorders: Helen Calhoun    Other medication(s) to be shipped: No additional medications requested for fill at this time     Helen Calhoun, DOB: 1991/03/25  Phone: [phone] (home)       All above HIPAA information was verified with patient.     Was a Nurse, learning disability used for this call? No    Completed refill call assessment today to schedule patient's medication shipment from the [facility] [phone]).  All relevant notes have been reviewed.     Specialty medication(s) and dose(s) confirmed: Regimen is correct and unchanged.   Changes to medications: Helen Calhoun reports no changes at this time.  Changes to insurance: No  New side effects reported not previously addressed with a pharmacist or physician: None reported  Questions for the pharmacist: No    Confirmed patient received a Conservation officer, historic buildings and a Surveyor, mining with first shipment. The patient will receive a drug information handout for each medication shipped and additional FDA Medication Guides as required.       DISEASE/MEDICATION-SPECIFIC INFORMATION        N/A    SPECIALTY MEDICATION ADHERENCE     Medication Adherence    Patient reported X missed doses in the last month: 2  Specialty Medication: Helen Calhoun 5mg   Patient is on additional specialty medications: No  Patient is on more than two specialty medications: No  Any gaps in refill history greater than 2 weeks in the last 3 months: no  Demonstrates understanding of importance of adherence: yes  Informant: patient  Reliability of informant: reliable  Patient is at risk for Non-Adherence: No  Reasons for non-adherence: patient forgets              Were doses missed due to medication being on hold? No    xeljnaz 5 mg: 4 days of medicine on hand         REFERRAL TO PHARMACIST     Referral to the pharmacist: Not needed      [facility]     Shipping address confirmed in Epic.     Delivery Scheduled: Yes, Expected medication delivery date: 10/11.     Medication will be delivered via Same Day Courier to the prescription address in Epic WAM.    Helen Calhoun   [facility] Specialty Technician

## 2021-09-16 ENCOUNTER — Institutional Professional Consult (permissible substitution): Admit: 2021-09-16 | Discharge: 2021-09-16 | Payer: PRIVATE HEALTH INSURANCE

## 2021-09-16 ENCOUNTER — Ambulatory Visit
Admit: 2021-09-16 | Discharge: 2021-09-16 | Payer: PRIVATE HEALTH INSURANCE | Attending: Internal Medicine | Primary: Internal Medicine

## 2021-09-16 DIAGNOSIS — K5010 Crohn's disease of large intestine without complications: Principal | ICD-10-CM

## 2021-09-16 DIAGNOSIS — Z23 Encounter for immunization: Principal | ICD-10-CM

## 2021-09-16 DIAGNOSIS — K5100 Ulcerative (chronic) pancolitis without complications: Principal | ICD-10-CM

## 2021-09-16 LAB — BASIC METABOLIC PANEL
ANION GAP: 11 mmol/L (ref 5–14)
BLOOD UREA NITROGEN: 12 mg/dL (ref 9–23)
BUN / CREAT RATIO: 21
CALCIUM: 10.5 mg/dL — ABNORMAL HIGH (ref 8.7–10.4)
CHLORIDE: 102 mmol/L (ref 98–107)
CO2: 24.3 mmol/L (ref 20.0–31.0)
CREATININE: 0.56 mg/dL — ABNORMAL LOW
EGFR CKD-EPI (2021) FEMALE: >90 mL/min/{1.73_m2} (ref >=60)
GLUCOSE RANDOM: 86 mg/dL (ref 70–179)
POTASSIUM: 4.4 mmol/L (ref 3.4–4.8)
SODIUM: 137 mmol/L (ref 135–145)

## 2021-09-16 LAB — CBC W/ AUTO DIFF
BASOPHILS ABSOLUTE COUNT: 0 10*9/L (ref 0.0–0.1)
BASOPHILS RELATIVE PERCENT: 0.8 %
EOSINOPHILS ABSOLUTE COUNT: 0.1 10*9/L (ref 0.0–0.5)
EOSINOPHILS RELATIVE PERCENT: 2.9 %
HEMATOCRIT: 38 % (ref 34.0–44.0)
HEMOGLOBIN: 12.7 g/dL (ref 11.3–14.9)
LYMPHOCYTES ABSOLUTE COUNT: 1.4 10*9/L (ref 1.1–3.6)
LYMPHOCYTES RELATIVE PERCENT: 29.5 %
MEAN CORPUSCULAR HEMOGLOBIN CONC: 33.4 g/dL (ref 32.0–36.0)
MEAN CORPUSCULAR HEMOGLOBIN: 32.1 pg (ref 25.9–32.4)
MEAN CORPUSCULAR VOLUME: 96.1 fL — ABNORMAL HIGH (ref 77.6–95.7)
MEAN PLATELET VOLUME: 9.6 fL (ref 6.8–10.7)
MONOCYTES ABSOLUTE COUNT: 0.3 10*9/L (ref 0.3–0.8)
MONOCYTES RELATIVE PERCENT: 6.3 %
NEUTROPHILS ABSOLUTE COUNT: 2.8 10*9/L (ref 1.8–7.8)
NEUTROPHILS RELATIVE PERCENT: 60.5 %
NUCLEATED RED BLOOD CELLS: 0 /100{WBCs} (ref <=4)
PLATELET COUNT: 233 10*9/L (ref 150–450)
RED BLOOD CELL COUNT: 3.96 10*12/L (ref 3.95–5.13)
RED CELL DISTRIBUTION WIDTH: 13.7 % (ref 12.2–15.2)
WBC ADJUSTED: 4.7 10*9/L (ref 3.6–11.2)

## 2021-09-16 LAB — IRON & TIBC
IRON SATURATION: 12 % — ABNORMAL LOW (ref 20–55)
IRON: 49 ug/dL — ABNORMAL LOW
TOTAL IRON BINDING CAPACITY: 402 ug/dL (ref 250–425)

## 2021-09-16 LAB — HEPATIC FUNCTION PANEL
ALBUMIN: 4.5 g/dL (ref 3.4–5.0)
ALKALINE PHOSPHATASE: 204 U/L — ABNORMAL HIGH (ref 46–116)
ALT (SGPT): 118 U/L — ABNORMAL HIGH (ref 10–49)
AST (SGOT): 73 U/L — ABNORMAL HIGH (ref <=34)
BILIRUBIN DIRECT: 0.2 mg/dL (ref 0.00–0.30)
BILIRUBIN TOTAL: 0.7 mg/dL (ref 0.3–1.2)
PROTEIN TOTAL: 8.4 g/dL — ABNORMAL HIGH (ref 5.7–8.2)

## 2021-09-16 LAB — SEDIMENTATION RATE: ERYTHROCYTE SEDIMENTATION RATE: 40 mm/h — ABNORMAL HIGH (ref 0–20)

## 2021-09-16 LAB — VITAMIN B12: VITAMIN B-12: 521 pg/mL (ref 211–911)

## 2021-09-16 LAB — C-REACTIVE PROTEIN: C-REACTIVE PROTEIN: <4 mg/L (ref <=10.0)

## 2021-09-16 LAB — FERRITIN: FERRITIN: 22.2 ng/mL

## 2021-09-16 MED ORDER — DIPHENOXYLATE-ATROPINE 2.5 MG-0.025 MG TABLET
ORAL_TABLET | ORAL | 3 refills | 0.00000 days | Status: CP
Start: 2021-09-16 — End: ?

## 2021-09-16 MED ORDER — CLONAZEPAM 0.5 MG TABLET
ORAL_TABLET | Freq: Every evening | ORAL | 3 refills | 30.00000 days | Status: CP | PRN
Start: 2021-09-16 — End: ?

## 2021-09-16 MED ORDER — FLUOXETINE 20 MG TABLET
ORAL_TABLET | 0 refills | 0 days | Status: CP
Start: 2021-09-16 — End: ?

## 2021-09-16 MED FILL — XELJANZ 5 MG TABLET: ORAL | 30 days supply | Qty: 60 | Fill #3

## 2021-09-16 NOTE — Unmapped (Signed)
[facility] Consult Services  OSTOMY CLINIC VISIT NOTE     Reason for Consult:   - Ileostomy  - Pouching Issue    Problem List:   Active Problems:    * No active hospital problems. *    Assessment: [facility] consult requested for skin irritation, reduced wear time. Pt described irritation as red weepy skin surrounding her stoma. Pt declined offer to remove pouch for further evaluation. Pt is changing ostomy appliance daily to prevent unpredictable pouch changes. Based on pt description, I have requested samples for SNS barrier rings and ConvaTec durahesive system.      Stoma Type:  -  Ileostomy Stoma Location:  - RLQ (Right Lower Quadrant)     Stoma Characteristics:  - Round  - Budded Stoma Mucosal Condition and Color:  - Not able to assess at this time, pouch not removed     Mucocutaneous Junction:  - Not able to assess at this time, pouch not removed    Rod/Stents:  - No     Output:  - Brown  - Liquid  - Stool     Peristomal Skin Condition:   - Not able to assess at this time, pouch not removed    Abdominal Contours:  - Flat    Pouching System:  - 2 Piece  - Convex  - Pre-cut  - Moldable barrier ring  - Ostomy belt Anticipated Wear Time of Pouching System:  - Daily      Teaching/Instructions:  - Crusting peristomal skin irritation.  - Measuring stoma size and cutting skin barrier appropriately.  - Application of moldable barrier ring.  - Reviewed when to contact the outpatient WOC nurse with questions/concerns.    Recommendations/Plan:   - Patient to follow up with outpatient WOC nurse.    Plan of Care Discussed With:  - Patient    Ostomy Supplies:   - Samples requested     Ostomy Product List:  TBD     [facility] Follow Up:  - Pt will follow up after trial     Workup Time:  45 minutes     Dorris Fetch BSN RN [facility]  [facility] Consult Services   Pager [phone]

## 2021-09-16 NOTE — Unmapped (Signed)
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 year old female with a past medical history significant for alopecia universalis and Crohn's (PSC-IBD) colitis. She has previously failed multiple therapies including:  Remicade, humira, cimzia, mtx, 6mp (including combination therapy), stelara ([place] dosing off label, no prior infusion), vedolizumab therapy (partial response) then on tofacitinib maintenance at 5 mg PO  BID. Patient also with MRI/MRCP which showed stricturing and beading of the ducts consistent with PSC, liver biopsy also consistent with PSC, without any evidence of autoimmune overlap or DILI.  She underwent surveillance colonoscopy which demonstrated multifocal high grade dysplasia. She has since undergone colectomy with Dr. Drue Dun.    PSC-IBD (s/p total proctocolectomy in 09/2020) who underwent IPAA on 02/20/2021 without operative complications.     MRCP for monitoring 06/2020 - consistent with PSC, no cholangio    Path from resection:  COLON, EXCISION:               Tubulovillous adenoma with focal high-grade dysplasia.               Chronic active colitis, consistent with patient's known history of Crohn's disease.               No invasive carcinoma identified.               Appendix with no histologic abnormality.               117 lymph nodes, negative for carcinoma (0/117).                 Interval history:    We had discussed continuing on Xeljanz post operatively, as PSC-IBD is high risk for pouchitis. She has continued on this, and continues to feel well.  She did lose weight around her surgeries, down to 104, now at 112.  She is having skin irritation around her stoma, and having to change her ostomy appliance daily.   She is soon to have her 3rd stage of her reconstruction and has questions about recovery after this.  She is also somewhat tearful and admits she is very anxious.        PROBLEMS:  Patient Active Problem List   Diagnosis   ??? Crohn's colitis (CMS-HCC)   ??? Alopecia areata   ??? Chronic rhinitis   ??? Acute tonsillitis   ??? Encounter for gynecological examination (general) (routine) without abnormal findings   ??? Atypical squamous cells of undetermined significance on cytologic smear of cervix (ASC-US)         PAST MEDICAL HISTORY:    Past Medical History:   Diagnosis Date   ??? Abnormal LFTs    ??? Alopecia areata totalis    ??? Conjunctivitis    ??? Crohn's disease (CMS-HCC)    ??? HPV (human papilloma virus) infection     has had the HPV vaccine but had an abnormal pap and colpo that was negative.    ??? PSC (primary sclerosing cholangitis)    ??? Sinusitis    ??? Streptococcal sore throat        INFLAMMATORY BOWEL DISEASE COURSE/PHENOTYPE:    Crohn's disease, originally diagnosed in 2008 with ileocolonic inflammatory involvement, biopsies demonstrated chronic active colitis that was  patchy in involvement. Colonoscopy 2015, which demonstrated moderate to severe activity in the right colon, with inflammation in the distal terminal ileum.  Prior treatment therapies have included Remicade, Humira, cimzia, methotrexate, 6-MP, stelara and then maintained on vedolizumab monotherapy. MRI/MRCP 2017 with diagnosis of PSC. Colon 08/2016 with pancolitis, path with diffuse mod-severe chronic active colitis. 2018: start of tofacitinib, with clinical response, tapered off of vedolizumab, now on maintenance 5 mg PO BID. Colonoscopy showed multifocal high grade dysplasia in 2021. Colectomy recommended, she has PSC-IBD which is more right sided, with backwash ileitis.    PSC-IBD (s/p total proctocolectomy in 09/2020) who underwent IPAA on 02/20/2021 without operative complications.     Path from resection:  COLON, EXCISION:               Tubulovillous adenoma with focal high-grade dysplasia. Chronic active colitis, consistent with patient's known history of Crohn's disease.               No invasive carcinoma identified.               Appendix with no histologic abnormality.               117 lymph nodes, negative for carcinoma (0/117).                   ALLERGIES:    Patient has no known allergies.    SOCIAL HISTORY: Working in [place],  Forensic scientist.    Social History     Socioeconomic History   ??? Marital status: Single   Tobacco Use   ??? Smoking status: Never Smoker   ??? Smokeless tobacco: Never Used   Vaping Use   ??? Vaping Use: Never used   Substance and Sexual Activity   ??? Alcohol use: Yes     Alcohol/week: 0.0 standard drinks     Comment: 4 drinks/week    ??? 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      REVIEW OF SYSTEMS:     The balance of 12 systems reviewed is negative except as noted in the HPI.         VITAL SIGNS:    There were no vitals taken for this visit.     Wt Readings from Last 6 Encounters:   09/16/21 51 kg (112 lb 6.4 oz)   05/22/20 54.4 kg (120 lb)   04/16/20 52.7 kg (116 lb 3.2 oz)   09/13/18 60.9 kg (134 lb 3.2 oz)   01/31/18 59.4 kg (131 lb)   01/17/18 58.4 kg (128 lb 11.2 oz)       PHYSICAL EXAM:    Constitutional:   Alert, oriented x 3, no acute distress, well nourished, and well hydrated.   Mental Status:   Thought organized, appropriate affect, pleasantly interactive, not anxious appearing.   HEENT:   PERRL, conjunctiva clear, anicteric, oropharynx clear, neck supple, no LAD.   Respiratory: Clear to auscultation, unlabored breathing.     Cardiac: Euvolemic, regular rate and rhythm, normal S1 and S2, no murmur.     Abdomen: Soft, normal bowel sounds, non-distended,  non-tender, no organomegaly or masses. Ostomy site in RLQ intact.     Perianal/Rectal Exam Not performed.     Extremities:   No edema, well perfused.   Musculoskeletal: No joint swelling or tenderness noted, no deformities.     Skin: No rashes, jaundice or skin lesions noted.     Neuro: No focal deficits.           ASSESSMENT:        30 year old white female with PSC-IBD, PSC, psoriasis, and alopecia, s/p colectomy for high grade dysplasia, now with IPAA. She is soon to undergo her ostomy takedown, will have pouchogram to make sure no leaks prior to that. She is currently on tofacitinib and tolerating this well. WE discussed holding this 2 days prior to her surgery, and then resuming post operatively 2 weeks after surgery. She is high risk for pouchitis given her PSC and the tofacitinib has controlled her joint symptoms as well. We discussed labs and optimizing nutrition and vitamin/iron levels prior to surgery. We discussed the need for pouch surveillance of her cuff for dysplasia at least every 3 years.     PLAN:          1.  PSC-IBD: will undergo ostomy take down next month. Hold tofacitinib starting 2 days prior to surgery, will resume 2 weeks post operatively. Prescribed lomotil for her to have on hand after surgery. Provided lomotil as needed for post-operative symptom relief, discussed typical recovery after ostomy takedown. We can complete FMLA paperwork for her.    2.  Prior Dysplasia - will need pouchoscopy q 3 years for surveillance, will plan on pouchoscopy 1 year after takedown.    3. PSC: check LFTS today, patient did have MRCP 06/2020 with no malignancy, stable changes of PSC, will plan to repeat this in 2023    4.  Alopecia: She now has had some improvement with tofacitinib, this is also been reported in the literature indicates report format for alopecia that [name] can be effective. She has regrown her eyelashes and hair on extremities and is pleased with this.    5. Anxiety - we discussed increasing her prozac to 40 mg daily for the short term, encouraged her to reestablish with a psychiatrist as well    6. Prevention: She is up-to-date on Prevnar 13 as well as PPSV23 vaccine.  annual influenza vaccine today. Got Pfizer COVID vaccine series.  I emphasized the need for sunscreen use, as well as screening skin examinations. Shingrix #1 today (she is on JAK inhibitor), will plan on #2 at next follow up visit    7. Ostomy irritation - will meet with our ostomy nurse today    8. Follow up in 3 months, for post operative follow up    I personally spent 40 minutes face-to-face and non-face-to-face in the care of this patient, which includes all pre, intra, and post visit time on the date of service.          Misty Foutz D. Caeleigh Prohaska MD, MPH  Professor of Medicine  [employer] [facility] Gastroenterology and Hepatology        DIAGNOSTIC STUDIES:  I have reviewed all pertinent diagnostic studies, including:    Liver biopsy:  01/2018  A: Liver, core biopsy   - Mild fibrosis portal expansion and positive copper staining in many periportal hepatocytes (see comment)  - Rare spotty lobular inflammation and rare portal and lobular ceroid laden macrophages    GI Procedures:  Colonoscopy 05/2020    Impression:            - Mayo Score 1 with patchy areas of Mayo 2 in the                          ascending colon and cecum. Targeted surveillence                          biopsies taken with chromoscopy performed. Overall                          markedly improved from prior examination.                         - The examined portion of the ileum was normal.                         - Very mild inflammation was found. This was graded as                          Mayo Score 1 (mild disease), markedly improved                          compared to previous examinations.                         - Anal papilla(e) were hypertrophied.                         - Several biopsies were obtained in the sigmoid colon, in the descending colon, in the transverse colon and                          in the ascending colon.                         - Chromoscopy was performed.                         - Biopsies were taken with a cold forceps for                          histology in the rectum, in the sigmoid colon, in the                          descending colon, in the transverse colon, in the                          ascending colon and in the cecum.        Pathology:    08/2016 Colonoscopy       - Preparation of the colon was fair. This was improved                        to adequate with lavage.                       - Abnormal perianal exam with anal canal tenderness.                       -  Congested, erythematous, hemorrhagic, ulcerated and                        vascular-pattern-decreased mucosa in the entire examined                        colon. Biopsied from the left and right colon for                        surveillance.  Pathology:  A: Colon, ascending, biopsy   - Diffuse moderate to severe chronic active colitis with reactive epithelial atypia, negative for dysplasia  - Ulceration with inflamed granulation tissue  - No CMV viral cytopathic effect or granulomas identified  ??  B: Colon, descending, biopsy   - Diffuse moderate to severe chronic active colitis, negative for dysplasia  - Ulceration with inflamed granulation tissue  - No CMV viral cytopathic effect or granulomas identified    Pathology:  A: Colon, ascending, biopsy  -Moderate chronic active colitis with multiple foci of villiform low to high-grade dysplasia; see comment    B: Colon, transverse, biopsy  -Moderate chronic active colitis with multiple foci of low-grade dysplasia and foci suspicious for high-grade dysplasia; see comment    C: Colon, descending, biopsy  -Mild chronic active colitis with focus of low-grade dysplasia; see comment    D: Colon, rectosigmoid, biopsy  -Mild to moderate chronic active colitis  -No evidence of dysplasia      08/2014 colonoscopy  Impression: - Congested, erythematous and ulcerated mucosa in the   entire examined colon with patchy distrubution.   Biopsied. Consistent with moderate activity of Crohn's   colitis.  - The examined portion of the ileum was normal.  - The distal rectum and anal verge are normal on   retroflexion view.    Pathology:  A: Colon, right, biopsy  - Moderate to severe chronic active colitis with crypt depletion and erosion,  negative for dysplasia  - No granulomas identified    B: Colon, left, biopsy   - Moderate chronic active colitis, negative for dysplasia  - No granulomas identified      2012 colonoscopy:  Impression: - Erythematous and granular mucosa in the rectum. This was biopsied. This was consistent with mild activity of Crohn's disease. - Erythematous, hemorrhagic, inflamed and ulcerated mucosa in the descending colon, in the transverse colon, in the ascending colon and in the cecum. This was biopsied. This was consistent with moderate to severe activity of Crohn's disease. - Congested mucosa in the terminal ileum for 5-10 cm, normal ileum above this level.     Pathology:  A: Small bowel, terminal ileum, biopsy - Severe chronic active enteritis, consistent with IBD, negative for dysplasia - Ulceration with inflamed granulation tissue - No CMV viral cytopathic effect identified B: Colon, right, biopsy - Moderate to severe chronic active colitis, negative for dysplasia - No CMV viral cytopathic effect identified. C: Colon, left, biopsy - Moderate to severe chronic active colitis, negative for dysplasia - No CMV viral cytopathic effect identified.       LIVER BIOPSY   A: Liver, core biopsy   - Chronic hepatitis, etiology undetermined, grade 1, stage 0 (see comment)     Comment:  The histologic features are those of a low grade chronic hepatitis process  without increased numbers of plasma cells. The differential diagnosis includes  viral and drug etiologies and autoimmune hepatitis is not favored.  Radiology results:    MRCP 06/2020 [facility])    IMPRESSION:   1. Intrahepatic biliary tree can be seen to the periphery with some   irregular beading and??narrowing suggested. Findings are suspicious   for primary sclerosing cholangitis as suggested on previous MRI.   Comparison imaging is not available.   2. No focal, suspicious hepatic lesion or sign of dominant   stricture.   3. Suggestion of mild thickening of the ascending colon on coronal   images with relatively ahaustral appearance of the descending colon.   Findings may reflect sequela of inflammatory bowel disease, not well   assessed.           MRI/MRCP 2018  Impression     --Little interval change from 2017, including appearance of the biliary ducts suggestive of PSC.   --Patchy arterial enhancement, probably perfusional, less likely related to acute inflammatory process.         MRI/MRCP 2017    -- Mild multifocal beading / strictures of the bile ducts associated with slight multifocal dilatation in the intrahepatic bile ducts. These findings are suggestive of primary sclerosing cholangitis in this patient with history of Crohn's disease.  --Edema, thickening and enhancement of the transverse and descending colon wall associated with stranding, mesocolic engorgement and associated prominent lymph nodes. These findings are suggestive of acute on chronic inflammation secondary to Crohn's colitis.  --Prominent mesocolic and omental and right lower quadrant lymph nodes. These are likely reactive and secondary to Crohn's disease.   --The gallbladder was hydropic at the time the exam. This could be due to Jorgeluis Gurganus fasting. No evidence of acute cholecystitis or obstruction at the level of the hepatic ducts.      Laboratory results:    No visits with results within 1 Week(s) from this visit.   Latest known visit with results is:   Admission on 05/22/2020, Discharged on 05/22/2020   Component Date Value Ref Range Status   ??? Diagnosis 05/22/2020    Final                    Value:This result contains rich text formatting which cannot be displayed here.   ??? Diagnosis Comment 05/22/2020    Final                    Value:This result contains rich text formatting which cannot be displayed here.   ??? Clinical History 05/22/2020    Final                    Value:This result contains rich text formatting which cannot be displayed here.   ??? Gross Description 05/22/2020    Final                    Value:This result contains rich text formatting which cannot be displayed here.   ??? Microscopic Description 05/22/2020    Final                    Value:This result contains rich text formatting which cannot be displayed here.   ??? Disclaimer 05/22/2020    Final                    Value:This result contains rich text formatting which cannot be displayed here.

## 2021-09-16 NOTE — Unmapped (Addendum)
Contrast study via enema  Xeljanz 5 mg 2 x daily - stop 2 days prior to surgery, resume at 2 week post op  Prozac 40 mg daily (but can reduce back down to 20 mg)  Klonopin at night   Continue dietary adjustments, carobohydrates, peanut butter, marshmallows  LAbs today for monitoring  Shingles vaccine and flu vaccine today, bivalent covid booster at the local pharmacy next week  3 month follow up

## 2021-09-17 DIAGNOSIS — E611 Iron deficiency: Principal | ICD-10-CM

## 2021-09-17 NOTE — Unmapped (Signed)
-----   Message from Monte Fantasia, MD sent at 09/17/2021 10:23 AM EDT -----  Her iron is low, can you auth iron dextran for her?  ML

## 2021-09-17 NOTE — Unmapped (Signed)
Per Dr. Jacqulyn Bath request, InFed therapy plan placed now for approval and scheduling next steps.  PA pending at this time.

## 2021-09-19 LAB — VITAMIN D 25 HYDROXY: VITAMIN D, TOTAL (25OH): 25.8 ng/mL (ref 20.0–80.0)

## 2021-09-25 NOTE — Unmapped (Signed)
Just messaged it to her right before this :)   Thanks!  Helen Calhoun

## 2021-10-17 NOTE — Unmapped (Signed)
[facility] Clinical Assessment & Refill Coordination Note    Helen Calhoun is currently on HOLD to prevent delayed wound healing from recent surgery on 11/7. She plans to resume treatment 2 weeks post operatively.     Helen Calhoun, DOB: [birth year]  Phone: [phone] (home)     All above HIPAA information was verified with patient.     Was a Nurse, learning disability used for this call? No    Specialty Medication(s):   Inflammatory Disorders: Helen Calhoun     Current Outpatient Medications   Medication Sig Dispense Refill   ??? clonazePAM (KLONOPIN) 0.5 MG tablet Take 1 tablet (0.5 mg total) by mouth nightly as needed for anxiety. 30 tablet 3   ??? dextroamphetamine-amphetamine (ADDERALL) 10 mg tablet Take 10 mg by mouth.     ??? diphenoxylate-atropine (LOMOTIL) 2.5-0.025 mg per tablet 2 tabs PO QID prn 240 tablet 3   ??? FLUoxetine (PROZAC) 20 MG tablet 40 mg PO QD 180 tablet 0   ??? melatonin 3 mg Tab Take 6 mg by mouth.     ??? tofacitinib (XELJANZ) 5 mg Tab tablet Take 1 tablet (5 mg total) by mouth Two (2) times a day. 180 tablet 3     No current facility-administered medications for this visit.        Changes to medications: Helen Calhoun reports having started new medications since having surgery but does not know the names of the medications. She is currently not taking Helen Calhoun to allow for a speedy recovery but will inform pharmacy if there are any changes once she restarts Xeljanz by next refill call    Allergies   Allergen Reactions   ??? Oxycodone Itching       Changes to allergies: No    SPECIALTY MEDICATION ADHERENCE     Xeljanz 5 mg: ~14 days of medicine on hand     Medication Adherence    Patient reported X missed doses in the last month: >5  Specialty Medication: Xeljanz 5 mg BID  Patient is on additional specialty medications: No  Informant: patient          Specialty medication(s) dose(s) confirmed: Regimen is correct and unchanged.     Are there any concerns with adherence? No - Helen Calhoun was held one week before surgery (11/7) and is currently being held for 2 weeks post-op to prevent delayed wound healing.      Adherence counseling provided? Not needed    CLINICAL MANAGEMENT AND INTERVENTION      Clinical Benefit Assessment:    Do you feel the medicine is effective or helping your condition? Yes    Clinical Benefit counseling provided? Progress note from 10/11 shows evidence of clinical benefit    Adverse Effects Assessment:    Are you experiencing any side effects? No    Are you experiencing difficulty administering your medicine? No    Quality of Life Assessment:    Quality of Life    Rheumatology  Oncology  Dermatology  Cystic Fibrosis          How many days over the past month did your ulcerative colitis  keep you from your normal activities? For example, brushing your teeth or getting up in the morning. 0    Have you discussed this with your provider? Not needed    Acute Infection Status:    Acute infections noted within Epic:  No active infections  Patient reported infection: None    Therapy Appropriateness:    Is therapy appropriate and  patient progressing towards therapeutic goals? Yes, therapy is appropriate and should be continued    DISEASE/MEDICATION-SPECIFIC INFORMATION      N/A    PATIENT SPECIFIC NEEDS     - Does the patient have any physical, cognitive, or cultural barriers? No    - Is the patient high risk? No    - Does the patient require a Care Management Plan? No     - Does the patient require physician intervention or other additional services (i.e. nutrition, smoking cessation, social work)? No      SHIPPING     Specialty Medication(s) to be Shipped:   Inflammatory Disorders: Helen Calhoun    Other medication(s) to be shipped: No additional medications requested for fill at this time     Changes to insurance: No    Delivery Scheduled: Yes, Expected medication delivery date: 10/22/21.     Medication will be delivered via UPS to the confirmed temporary address in [facility]    The patient will receive a drug information handout for each medication shipped and additional FDA Medication Guides as required.  Verified that patient has previously received a Conservation officer, historic buildings and a Surveyor, mining.    The patient or caregiver noted above participated in the development of this care plan and knows that they can request review of or adjustments to the care plan at any time.      All of the patient's questions and concerns have been addressed.    Helen Calhoun   [facility] Specialty Pharmacist

## 2021-10-21 MED FILL — XELJANZ 5 MG TABLET: ORAL | 30 days supply | Qty: 60 | Fill #4

## 2021-11-18 ENCOUNTER — Ambulatory Visit: Admit: 2021-11-18 | Discharge: 2021-11-19 | Payer: PRIVATE HEALTH INSURANCE

## 2021-11-18 DIAGNOSIS — K5100 Ulcerative (chronic) pancolitis without complications: Principal | ICD-10-CM

## 2021-11-18 LAB — CBC
HEMATOCRIT: 36.4 % (ref 34.0–44.0)
HEMOGLOBIN: 12.2 g/dL (ref 11.3–14.9)
MEAN CORPUSCULAR HEMOGLOBIN CONC: 33.6 g/dL (ref 32.0–36.0)
MEAN CORPUSCULAR HEMOGLOBIN: 31.9 pg (ref 25.9–32.4)
MEAN CORPUSCULAR VOLUME: 94.9 fL (ref 77.6–95.7)
MEAN PLATELET VOLUME: 9.5 fL (ref 6.8–10.7)
PLATELET COUNT: 307 10*9/L (ref 150–450)
RED BLOOD CELL COUNT: 3.84 10*12/L — ABNORMAL LOW (ref 3.95–5.13)
RED CELL DISTRIBUTION WIDTH: 12.8 % (ref 12.2–15.2)
WBC ADJUSTED: 6.6 10*9/L (ref 3.6–11.2)

## 2021-11-18 LAB — IRON & TIBC
IRON SATURATION: 30 % (ref 20–55)
IRON: 106 ug/dL
TOTAL IRON BINDING CAPACITY: 354 ug/dL (ref 250–425)

## 2021-11-18 LAB — FERRITIN: FERRITIN: 21.9 ng/mL

## 2021-11-18 NOTE — Unmapped (Signed)
Patient came to GI clinic as she was unaware that she had an infusion appointment, she was under the impression she had a follow up with Dr Jacqulyn Bath.   She had J-pouch surgery about 5 weeks ago at [facility] and had several questions about IV iron. She would like to re do her iron levels to see if she really does need this.     Cancelled her app with [facility] for infed today at 9:00. Labs ordered to be done at [facility] per MD.

## 2021-11-24 NOTE — Unmapped (Signed)
The [facility] has made a second and final attempt to reach this patient to refill the following medication:Xeljanz.      We have left voicemails on the following phone numbers: [phone] and have sent a MyChart message.    Dates contacted: 12/12 and 12/19  Last scheduled delivery: 10/22/21    The patient may be at risk of non-compliance with this medication. The patient should call the [facility] at [phone]  Option 4, then Option 2 (all other specialty patients) to refill medication.    Unk Lightning   [facility] Specialty Technician

## 2021-12-17 NOTE — Unmapped (Signed)
[facility] Refill Coordination Note    Specialty Medication(s) to be Shipped:   Inflammatory Disorders: Helen Calhoun  **Patient was off of medication for a month due to having surgery**    Other medication(s) to be shipped: No additional medications requested for fill at this time     Helen Calhoun, DOB: October 22, 1991  Phone: [phone] (home)       All above HIPAA information was verified with patient.     Was a Nurse, learning disability used for this call? No    Completed refill call assessment today to schedule patient's medication shipment from the [facility] [phone]).  All relevant notes have been reviewed.     Specialty medication(s) and dose(s) confirmed: Regimen is correct and unchanged.   Changes to medications: [name] reports no changes at this time.  Changes to insurance: No  New side effects reported not previously addressed with a pharmacist or physician: None reported  Questions for the pharmacist: No    Confirmed patient received a Conservation officer, historic buildings and a Surveyor, mining with first shipment. The patient will receive a drug information handout for each medication shipped and additional FDA Medication Guides as required.       DISEASE/MEDICATION-SPECIFIC INFORMATION        N/A    SPECIALTY MEDICATION ADHERENCE     Medication Adherence    Patient reported X missed doses in the last month: 0  Specialty Medication: Helen Calhoun 5mg   Patient is on additional specialty medications: No        Were doses missed due to medication being on hold? No    Xeljanz 5 mg: 2 days of medicine on hand     REFERRAL TO PHARMACIST     Referral to the pharmacist: Not needed      [facility]     Shipping address confirmed in Epic.     Delivery Scheduled: Yes, Expected medication delivery date: 12/18/2021.     Medication will be delivered via Same Day Courier to the prescription address in Epic WAM.    Helen Calhoun   [facility] Specialty Technician

## 2021-12-18 MED FILL — XELJANZ 5 MG TABLET: ORAL | 30 days supply | Qty: 60 | Fill #5

## 2022-01-12 NOTE — Unmapped (Signed)
[facility] Refill Coordination Note    Specialty Medication(s) to be Shipped:   Inflammatory Disorders: Helen Calhoun    Other medication(s) to be shipped: No additional medications requested for fill at this time     Helen Calhoun, DOB: October 01, 1991  Phone: [phone] (home)       All above HIPAA information was verified with patient.     Was a Nurse, learning disability used for this call? No    Completed refill call assessment today to schedule patient's medication shipment from the [facility] [phone]).  All relevant notes have been reviewed.     Specialty medication(s) and dose(s) confirmed: Regimen is correct and unchanged.   Changes to medications: [name] reports no changes at this time.  Changes to insurance: No  New side effects reported not previously addressed with a pharmacist or physician: None reported  Questions for the pharmacist: No    Confirmed patient received a Conservation officer, historic buildings and a Surveyor, mining with first shipment. The patient will receive a drug information handout for each medication shipped and additional FDA Medication Guides as required.       DISEASE/MEDICATION-SPECIFIC INFORMATION        N/A    SPECIALTY MEDICATION ADHERENCE     Medication Adherence    Patient reported X missed doses in the last month: 0  Specialty Medication: XELJANZ 5 mg  Patient is on additional specialty medications: No  Patient is on more than two specialty medications: No  Any gaps in refill history greater than 2 weeks in the last 3 months: no  Demonstrates understanding of importance of adherence: yes  Informant: patient  Reliability of informant: reliable  Confirmed plan for next specialty medication refill: delivery by pharmacy  Refills needed for supportive medications: not needed              Were doses missed due to medication being on hold? No    XELJANZ 5 mg : 5 days of medicine on hand       REFERRAL TO PHARMACIST     Referral to the pharmacist: Not needed      [facility]     Shipping address confirmed in Epic.     Delivery Scheduled: Yes, Expected medication delivery date: 01/16/22.     Medication will be delivered via Next Day Courier to the prescription address in Epic WAM.    [name]   [facility] Specialty Technician

## 2022-01-15 MED FILL — XELJANZ 5 MG TABLET: ORAL | 30 days supply | Qty: 60 | Fill #6

## 2022-02-09 NOTE — Unmapped (Signed)
The [facility] has made a second and final attempt to reach this patient to refill the following medication:Xeljanz.      We have left voicemails on the following phone numbers: [phone] and have sent a MyChart message.    Dates contacted: 02/04/22 & 02/09/22    Last scheduled delivery: 01/15/22 (30DS)    The patient may be at risk of non-compliance with this medication. The patient should call the [facility] at [phone]  Option 4, then Option 2 (all other specialty patients) to refill medication.    [name]   [facility] Specialty Technician

## 2022-03-09 ENCOUNTER — Telehealth: Payer: Self-pay | Admitting: Psychiatry

## 2022-03-09 MED ORDER — FLUOXETINE 20 MG TABLET
ORAL_TABLET | 0 refills | 0 days | Status: CP
Start: 2022-03-09 — End: ?

## 2022-03-09 NOTE — Telephone Encounter (Signed)
Patient left VM stating she was supposed to have been referred to a new provider some months ago after the retirement of her former provider Dr. Murrell Converse. Requesting call back to confirm/schedule. Appointment history indicated the last appointment was 02/14/2021. Called patient back explaining that this practice is in [place] not [place] where she was previously seen. Offered to schedule an appointment or refer to the [facility] office. Also explained the wait time of 5 weeks currently as she needs medication refills in approximately one week. She requested to schedule an appointment with [facility] and will call the [facility] clinic to inquire about an earlier appointment. She also agreed to call [facility] and cancel if she can be seen sooner at that location.  ? ?She called back after speaking with that office stating she had been told Dr. Vanetta Shawl had been assigned to her care. A check of her last progress note revealed no such referral. Suggested she call the [facility] office back and inform them that the appointment with Dr. Vanetta Shawl was only added today and pursue an earlier appointment or a walk-in appointment there and reinforced that [facility] would still be an option if needed. Supplied contact information for the [place] office and she plans to follow up again with that practice. ?

## 2022-03-09 NOTE — Unmapped (Signed)
Received call from [name] to discuss the new patient process-writer explained the packet and wait list and [name] had some concerns about being seen by residents-she wanted to look elsewhere and would call back if needed

## 2022-03-09 NOTE — Unmapped (Signed)
[facility] Refill Coordination Note    Specialty Medication(s) to be Shipped:   Inflammatory Disorders: Helen Calhoun    Other medication(s) to be shipped: No additional medications requested for fill at this time     Helen Calhoun, DOB: 1991-02-17  Phone: [phone] (home)       All above HIPAA information was verified with patient.     Was a Nurse, learning disability used for this call? No    Completed refill call assessment today to schedule patient's medication shipment from the [facility] [phone]).  All relevant notes have been reviewed.     Specialty medication(s) and dose(s) confirmed: Regimen is correct and unchanged.   Changes to medications: [name] reports no changes at this time.  Changes to insurance: No  New side effects reported not previously addressed with a pharmacist or physician: None reported  Questions for the pharmacist: No    Confirmed patient received a Conservation officer, historic buildings and a Surveyor, mining with first shipment. The patient will receive a drug information handout for each medication shipped and additional FDA Medication Guides as required.       DISEASE/MEDICATION-SPECIFIC INFORMATION        N/A    SPECIALTY MEDICATION ADHERENCE     Medication Adherence    Patient reported X missed doses in the last month: 1-2  Specialty Medication: XELJANZ 5 mg  Patient is on additional specialty medications: No  Informant: patient  Reasons for non-adherence: patient forgets      XELJANZ  7 days worth of medication on hand.        Were doses missed due to medication being on hold? No    REFERRAL TO PHARMACIST     Referral to the pharmacist: Not needed      [facility]     Shipping address confirmed in Epic.     Delivery Scheduled: Yes, Expected medication delivery date: 03/11/22.     Medication will be delivered via Same Day Courier to the prescription address in Epic WAM.    [place] A [name]   [facility] Specialty Technician

## 2022-03-10 MED ORDER — FLUOXETINE 40 MG CAPSULE
ORAL_CAPSULE | Freq: Every day | ORAL | 3 refills | 90 days | Status: CP
Start: 2022-03-10 — End: 2023-03-10

## 2022-03-11 MED FILL — XELJANZ 5 MG TABLET: ORAL | 30 days supply | Qty: 60 | Fill #7

## 2022-04-07 NOTE — Unmapped (Signed)
[facility] Clinical Assessment & Refill Coordination Note    Helen Calhoun, DOB: Jul 16, 1991  Phone: [phone] (home)     All above HIPAA information was verified with patient.     Was a Nurse, learning disability used for this call? No    Specialty Medication(s):   Inflammatory Disorders: [name]     Current Outpatient Medications   Medication Sig Dispense Refill   ??? clonazePAM (KLONOPIN) 0.5 MG tablet Take 1 tablet (0.5 mg total) by mouth nightly as needed for anxiety. 30 tablet 3   ??? dextroamphetamine-amphetamine (ADDERALL) 10 mg tablet Take 10 mg by mouth.     ??? diphenoxylate-atropine (LOMOTIL) 2.5-0.025 mg per tablet 2 tabs PO QID prn 240 tablet 3   ??? FLUoxetine (PROZAC) 40 MG capsule Take 1 capsule (40 mg total) by mouth daily. 90 capsule 3   ??? melatonin 3 mg Tab Take 6 mg by mouth.     ??? tofacitinib (XELJANZ) 5 mg Tab tablet Take 1 tablet (5 mg total) by mouth Two (2) times a day. 180 tablet 3     No current facility-administered medications for this visit.        Changes to medications: Helen Calhoun reports no changes at this time.    Allergies   Allergen Reactions   ??? Oxycodone Itching       Changes to allergies: No    SPECIALTY MEDICATION ADHERENCE     Xeljanz 5 mg: 7 days of medicine on hand     Medication Adherence    Patient reported X missed doses in the last month: 1-2  Specialty Medication: Xeljanz 5 mg BID  Patient is on additional specialty medications: No  Informant: patient          Specialty medication(s) dose(s) confirmed: Regimen is correct and unchanged.     Are there any concerns with adherence? No    Adherence counseling provided? Not needed    CLINICAL MANAGEMENT AND INTERVENTION      Clinical Benefit Assessment:    Do you feel the medicine is effective or helping your condition? Yes    Clinical Benefit counseling provided? Not needed    Adverse Effects Assessment:    Are you experiencing any side effects? No    Are you experiencing difficulty administering your medicine? No    Quality of Life Assessment:    Quality of Life    Rheumatology  Oncology  Dermatology  Cystic Fibrosis          How many days over the past month did your ulcerative pancolitis  keep you from your normal activities? For example, brushing your teeth or getting up in the morning. 0 - Helen Calhoun denies experiencing any symptoms related to UC at this time    Have you discussed this with your provider? Not needed    Acute Infection Status:    Acute infections noted within Epic:  No active infections  Patient reported infection: None    Therapy Appropriateness:    Is therapy appropriate and patient progressing towards therapeutic goals? Yes, therapy is appropriate and should be continued    DISEASE/MEDICATION-SPECIFIC INFORMATION      N/A    PATIENT SPECIFIC NEEDS     - Does the patient have any physical, cognitive, or cultural barriers? No    - Is the patient high risk? No    - Does the patient require a Care Management Plan? No     SOCIAL DETERMINANTS OF HEALTH     At  the [facility], we have learned that life circumstances - like trouble affording food, housing, utilities, or transportation can affect the health of many of our patients. That is why we wanted to ask: are you currently experiencing any life circumstances that are negatively impacting your health and/or quality of life? No    Social Determinants of Psychologist, prison and probation services Strain: Not on file   Internet Connectivity: Not on file   Food Insecurity: Not on file   Tobacco Use: Low Risk    ??? Smoking Tobacco Use: Never   ??? Smokeless Tobacco Use: Never   ??? Passive Exposure: Not on file   Housing/Utilities: Not on file   Alcohol Use: Not on file   Transportation Needs: Not on file   Substance Use: Not on file   Health Literacy: Not on file   Physical Activity: Not on file   Interpersonal Safety: Not on file   Stress: Not on file   Intimate Partner Violence: Not on file   Depression: Not on file   Social Connections: Not on file Would you be willing to receive help with any of the needs that you have identified today? Not applicable       SHIPPING     Specialty Medication(s) to be Shipped:   Inflammatory Disorders: [name]    Other medication(s) to be shipped: No additional medications requested for fill at this time     Changes to insurance: No    Delivery Scheduled: Yes, Expected medication delivery date: 04/08/22.     Medication will be delivered via Same Day Courier to the confirmed prescription address in [facility].    The patient will receive a drug information handout for each medication shipped and additional FDA Medication Guides as required.  Verified that patient has previously received a Conservation officer, historic buildings and a Surveyor, mining.    The patient or caregiver noted above participated in the development of this care plan and knows that they can request review of or adjustments to the care plan at any time.      All of the patient's questions and concerns have been addressed.    Oliva Bustard   [facility] Specialty Pharmacist

## 2022-04-08 MED FILL — XELJANZ 5 MG TABLET: ORAL | 30 days supply | Qty: 60 | Fill #8

## 2022-04-13 NOTE — Progress Notes (Deleted)
BH MD/PA/NP OP Progress Note ? ?04/13/2022 12:40 PM ?[name]  ?MRN:  [id number] ? ?Chief Complaint: No chief complaint on file. ? ?HPI:  ?Angela Little is a 31 y.o. year old female with a history of adult ADHD, anxiety, Crohn's colitis with previous IPAA (ileal pouch anal asnastomosis), who transferred care from Dr. Hinton Dyer.  ? ? ? ? ?  ? ?Visit Diagnosis: No diagnosis found. ? ?Past Psychiatric History: Please see initial evaluation for full details. I have reviewed the history. No updates at this time.  ?  ? ?Past Medical History:  ?Past Medical History:  ?Diagnosis Date  ? Alopecia   ? Crohn's disease (HCC)   ? Mononucleosis 02/19/2012  ? History 2012  ? Pharyngitis 02/19/2012  ? No past surgical history on file. ? ?Family Psychiatric History: Please see initial evaluation for full details. I have reviewed the history. No updates at this time.  ?  ? ?Family History:  ?Family History  ?Problem Relation Age of Onset  ? Arthritis Other   ? Cancer Other   ?     colon cancer  ? Hyperlipidemia Other   ? Hypertension Other   ? Alcohol abuse Cousin   ? ? ?Social History:  ?Social History  ? ?Socioeconomic History  ? Marital status: Single  ?  Spouse name: Not on file  ? Number of children: Not on file  ? Years of education: Not on file  ? Highest education level: Not on file  ?Occupational History  ? Occupation: Social worker  ?Tobacco Use  ? Smoking status: Never  ? Smokeless tobacco: Never  ?Vaping Use  ? Vaping Use: Never used  ?Substance and Sexual Activity  ? Alcohol use: No  ? Drug use: No  ? Sexual activity: Not on file  ?Other Topics Concern  ? Not on file  ?Social History Narrative  ? Not on file  ? ?Social Determinants of Health  ? ?Financial Resource Strain: Not on file  ?Food Insecurity: Not on file  ?Transportation Needs: Not on file  ?Physical Activity: Not on file  ?Stress: Not on file  ?Social Connections: Not on file  ? ? ?Allergies: No Known Allergies ? ?Metabolic Disorder  Labs: ?No results found for: HGBA1C, MPG ?No results found for: PROLACTIN ?No results found for: CHOL, TRIG, HDL, CHOLHDL, VLDL, LDLCALC ?No results found for: TSH ? ?Therapeutic Level Labs: ?No results found for: LITHIUM ?No results found for: VALPROATE ?No components found for:  CBMZ ? ?Current Medications: ?Current Outpatient Medications  ?Medication Sig Dispense Refill  ? Adalimumab (HUMIRA PEN Merrill) Inject into the skin once a week.    ? amphetamine-dextroamphetamine (ADDERALL) 10 MG tablet Take 1 tablet (10 mg total) by mouth 2 (two) times daily with a meal. 60 tablet 0  ? amphetamine-dextroamphetamine (ADDERALL) 10 MG tablet Take 1 tablet (10 mg total) by mouth 2 (two) times daily with a meal. 60 tablet 0  ? amphetamine-dextroamphetamine (ADDERALL) 10 MG tablet Take 1 tablet (10 mg total) by mouth 2 (two) times daily with a meal. 60 tablet 0  ? clonazePAM (KLONOPIN) 0.5 MG tablet Take 1 tablet (0.5 mg total) by mouth 2 (two) times daily as needed for anxiety (sleep). 60 tablet 1  ? FLUoxetine (PROZAC) 20 MG tablet Take 1 tablet (20 mg total) by mouth daily. 90 tablet 0  ? METHOTREXATE, ANTI-RHEUMATIC, PO Take by mouth once a week.    ? ?No current facility-administered medications for this visit.  ? ? ? ?Musculoskeletal: ?  Strength & Muscle Tone:  N/A ?Gait & Station:  N/A ?Patient leans: N/A ? ?Psychiatric Specialty Exam: ?Review of Systems  ?There were no vitals taken for this visit.There is no height or weight on file to calculate BMI.  ?General Appearance: {Appearance:22683}  ?Eye Contact:  {BHH EYE CONTACT:22684}  ?Speech:  Clear and Coherent  ?Volume:  Normal  ?Mood:  {BHH MOOD:22306}  ?Affect:  {Affect (PAA):22687}  ?Thought Process:  Coherent  ?Orientation:  Full (Time, Place, and Person)  ?Thought Content: Logical   ?Suicidal Thoughts:  {ST/HT (PAA):22692}  ?Homicidal Thoughts:  {ST/HT (PAA):22692}  ?Memory:  Immediate;   Good  ?Judgement:  {Judgement (PAA):22694}  ?Insight:  {Insight (PAA):22695}   ?Psychomotor Activity:  Normal  ?Concentration:  Concentration: Good and Attention Span: Good  ?Recall:  Good  ?Fund of Knowledge: Good  ?Language: Good  ?Akathisia:  No  ?Handed:  Right  ?AIMS (if indicated): not done  ?Assets:  Communication Skills ?Desire for Improvement  ?ADL's:  Intact  ?Cognition: WNL  ?Sleep:  {BHH GOOD/FAIR/POOR:22877}  ? ?Screenings: ? ? ?Assessment and Plan:  ?Assessment ? ?Plan ? ? ?The patient demonstrates the following risk factors for suicide: Chronic risk factors for suicide include: {Chronic Risk Factors for MEQASTM:19622297}. Acute risk factors for suicide include: {Acute Risk Factors for LGXQJJH:41740814}. Protective factors for this patient include: {Protective Factors for Suicide GYJE:56314970}. Considering these factors, the overall suicide risk at this point appears to be {Desc; low/moderate/high:110033}. Patient {ACTION; IS/IS YOV:78588502} appropriate for outpatient follow up.  ? ?  ? ?Collaboration of Care: Collaboration of Care: {BH OP Collaboration of DXAJ:28786767} ? ?Patient/Guardian was advised Release of Information must be obtained prior to any record release in order to collaborate their care with an outside provider. Patient/Guardian was advised if they have not already done so to contact the registration department to sign all necessary forms in order for [place] to release information regarding their care.  ? ?Consent: Patient/Guardian gives verbal consent for treatment and assignment of benefits for services provided during this visit. Patient/Guardian expressed understanding and agreed to proceed.  ? ? ?[name], MD ?04/13/2022, 12:40 PM ? ?

## 2022-04-15 ENCOUNTER — Encounter: Payer: Self-pay | Admitting: Psychiatry

## 2022-04-15 ENCOUNTER — Telehealth (INDEPENDENT_AMBULATORY_CARE_PROVIDER_SITE_OTHER): Payer: BC Managed Care – PPO | Admitting: Psychiatry

## 2022-04-15 DIAGNOSIS — F411 Generalized anxiety disorder: Secondary | ICD-10-CM

## 2022-04-15 DIAGNOSIS — F331 Major depressive disorder, recurrent, moderate: Secondary | ICD-10-CM | POA: Diagnosis not present

## 2022-04-15 MED ORDER — CLONAZEPAM 0.5 MG PO TABS: 0.5000 mg | ORAL_TABLET | Freq: Every day | ORAL | 0 refills | Status: DC | PRN

## 2022-04-15 MED ORDER — FLUOXETINE HCL 20 MG PO CAPS: 20.0000 mg | ORAL_CAPSULE | Freq: Every day | ORAL | 1 refills | Status: DC

## 2022-04-15 NOTE — Progress Notes (Signed)
Virtual Visit via Video Note ? ?I connected with Angela Little on 04/15/22 at  1:00 PM EDT by a video enabled telemedicine application and verified that I am speaking with the correct person using two identifiers. ? ?Location: ?Patient: home ?Provider: office ?Persons participated in the visit- patient, provider  ?  ?I discussed the limitations of evaluation and management by telemedicine and the availability of in person appointments. The patient expressed understanding and agreed to proceed. ?  ?I discussed the assessment and treatment plan with the patient. The patient was provided an opportunity to ask questions and all were answered. The patient agreed with the plan and demonstrated an understanding of the instructions. ?  ?The patient was advised to call back or seek an in-person evaluation if the symptoms worsen or if the condition fails to improve as anticipated. ? ?I provided 50 minutes of non-face-to-face time during this encounter. ? ? ?[name], MD ? ? ? ? Psychiatric Initial Adult Assessment  ? ?Patient Identification: Angela Little ?MRN:  [id number] ?Date of Evaluation:  04/15/2022 ?Referral Source: Gwen Pounds, MD  ?Chief Complaint:   ?Chief Complaint  ?Patient presents with  ? Establish Care  ? ?Visit Diagnosis:  ?  ICD-10-CM   ?1. GAD (generalized anxiety disorder)  F41.1   ?  ?2. MDD (major depressive disorder), recurrent episode, moderate (HCC)  F33.1   ?  ? ? ?History of Present Illness:   ?Angela Little is a 31 y.o. year old female with a history of adult ADHD, anxiety, Crohn's colitis with previous IPAA (ileal pouch anal anastomosis), who is transferred from Dr. Hinton Dyer.  ? ?Per chart review, she was seen by Dr. Hinton Dyer, last  in March 2022 for mild ADD, anxiety.  ? ?She states that she has made this appointment to have a psychiatrist to see regularly for her condition.  She used to see Dr. Hinton Dyer, and he was very helpful for her to go through surgeries in  relation to Crohn's disease.  She used to have an ostomy, and the recent surgery was performed as preventive measure for precancerous lesion on biopsy. Although it threw her over the edge, she is way less stressed after having done these surgery. She reports "medical anxiety" due to her experience around Crohn's disease. She started to have symptoms of Crohn's around 6-7 grade. She tends to feel very stressed when she gets MyChart message.  She is also worried whether people can notice her wig she wears for alopecia  She states that she is worried in general.  She talks about an example of her being worried about presentation this morning, and this appointment as she was worried that she might not able to make it as something could happen.  She has a plan for the trip this weekend.  Although she feels excited and it should be relaxing, she feels stressed as she is worried about her alopecia and her physical condition.  She thinks these anxiety has been consuming to her. She reports great support from her parents. She works full time.  She has good confidence/self esteem in what she does. She denies panic attacks except the time she had the surgery. She had an episode of blackout and nausea/vomiting at doctor's office in the past.   ? ?Depression- She has depressive symptoms as in PHQ9. She has middle insomnia due to her going to the bathroom after this surgery. She has significant fatigue. Although she has lost weight around surgery, she is back to her original  weight, and eats regular meals. She denies SI.  ? ?Substance- She drinks total of 7 drinks (wine, liquor) on weekends. She denies drug use ? ?Medication- fluoxetine 40mg  daily, clonazepam 0.5 mg daily prn (takes it once a week or less) (Although she used to be on Adderall IR 10 mg tid by Dr. , she is not expecting this medication to be restarted again, although it did help for her focus/anxiety. ) ? ?Support:parents ?Household: by herself ?Marital  status: single ?Number of children:0  ?Employment: full time,  [employer] ?Education:   ?Last PCP / ongoing medical evaluation:   ? ?Associated Signs/Symptoms: ?Depression Symptoms:  depressed mood, ?insomnia, ?fatigue, ?difficulty concentrating, ?anxiety, ?(Hypo) Manic Symptoms:   denies decreased need for sleep, euphoria ?Anxiety Symptoms:  Excessive Worry, ?Psychotic Symptoms:   denies AH, VH, paranoia ?PTSD Symptoms: ?Negative ? ?Past Psychiatric History:  ?Outpatient:  ?Psychiatry admission: denies  ?Previous suicide attempt: denies  ?Past trials of medication: fluoxetine, adderall, clonazepam ?History of violence:   ? ?Previous Psychotropic Medications: Yes  ? ?Substance Abuse History in the last 12 months:  No. ? ?Consequences of Substance Abuse: ?NA ? ?Past Medical History:  ?Past Medical History:  ?Diagnosis Date  ? Alopecia   ? Crohn's disease (HCC)   ? Mononucleosis 02/19/2012  ? History 2012  ? Pharyngitis 02/19/2012  ? History reviewed. No pertinent surgical history. ? ?Family Psychiatric History: as below ? ?Family History:  ?Family History  ?Problem Relation Age of Onset  ? Arthritis Other   ? Cancer Other   ?     colon cancer  ? Hyperlipidemia Other   ? Hypertension Other   ? Alcohol abuse Cousin   ? ? ?Social History:   ?Social History  ? ?Socioeconomic History  ? Marital status: Single  ?  Spouse name: Not on file  ? Number of children: Not on file  ? Years of education: Not on file  ? Highest education level: Not on file  ?Occupational History  ? Occupation: 02/21/2012  ?Tobacco Use  ? Smoking status: Never  ? Smokeless tobacco: Never  ?Vaping Use  ? Vaping Use: Never used  ?Substance and Sexual Activity  ? Alcohol use: No  ? Drug use: No  ? Sexual activity: Not on file  ?Other Topics Concern  ? Not on file  ?Social History Narrative  ? Not on file  ? ?Social Determinants of Health  ? ?Financial Resource Strain: Not on file  ?Food Insecurity: Not on file  ?Transportation Needs: Not  on file  ?Physical Activity: Not on file  ?Stress: Not on file  ?Social Connections: Not on file  ? ? ?Additional Social History: as above ? ?Allergies:  No Known Allergies ? ?Metabolic Disorder Labs: ?No results found for: HGBA1C, MPG ?No results found for: PROLACTIN ?No results found for: CHOL, TRIG, HDL, CHOLHDL, VLDL, LDLCALC ?No results found for: TSH ? ?Therapeutic Level Labs: ?No results found for: LITHIUM ?No results found for: CBMZ ?No results found for: VALPROATE ? ?Current Medications: ?Current Outpatient Medications  ?Medication Sig Dispense Refill  ? clonazePAM (KLONOPIN) 0.5 MG tablet Take 1 tablet (0.5 mg total) by mouth daily as needed for anxiety. 30 tablet 0  ? FLUoxetine (PROZAC) 20 MG capsule Take 1 capsule (20 mg total) by mouth daily. Total of 60 mg daily. Take along with 40 mg cap 30 capsule 1  ? Adalimumab (HUMIRA PEN [place]) Inject into the skin once a week.    ? amphetamine-dextroamphetamine (ADDERALL) 10  MG tablet Take 1 tablet (10 mg total) by mouth 2 (two) times daily with a meal. 60 tablet 0  ? amphetamine-dextroamphetamine (ADDERALL) 10 MG tablet Take 1 tablet (10 mg total) by mouth 2 (two) times daily with a meal. 60 tablet 0  ? amphetamine-dextroamphetamine (ADDERALL) 10 MG tablet Take 1 tablet (10 mg total) by mouth 2 (two) times daily with a meal. 60 tablet 0  ? clonazePAM (KLONOPIN) 0.5 MG tablet Take 1 tablet (0.5 mg total) by mouth 2 (two) times daily as needed for anxiety (sleep). 60 tablet 1  ? FLUoxetine (PROZAC) 20 MG tablet Take 1 tablet (20 mg total) by mouth daily. 90 tablet 0  ? METHOTREXATE, ANTI-RHEUMATIC, PO Take by mouth once a week.    ? ?No current facility-administered medications for this visit.  ? ? ?Musculoskeletal: ?Strength & Muscle Tone:  N/A ?Gait & Station:  N/A ?Patient leans: N/A ? ?Psychiatric Specialty Exam: ?Review of Systems  ?Psychiatric/Behavioral:  Positive for decreased concentration, dysphoric mood and sleep disturbance. Negative for agitation,  behavioral problems, confusion, hallucinations, self-injury and suicidal ideas. The patient is nervous/anxious. The patient is not hyperactive.   ?All other systems reviewed and are negative.  ?There wer

## 2022-05-06 DIAGNOSIS — K5100 Ulcerative (chronic) pancolitis without complications: Principal | ICD-10-CM

## 2022-05-06 MED ORDER — XELJANZ 5 MG TABLET
ORAL_TABLET | Freq: Two times a day (BID) | ORAL | 3 refills | 90 days
Start: 2022-05-06 — End: ?

## 2022-05-06 NOTE — Unmapped (Signed)
Blood work over due

## 2022-05-06 NOTE — Unmapped (Signed)
[facility] Refill Coordination Note    Specialty Medication(s) to be Shipped:   Inflammatory Disorders: [facility]    Other medication(s) to be shipped: No additional medications requested for fill at this time     Helen Cal[facility]oun, DOB: 04-Dec-1991  Phone: [phone] (home)       All above HIPAA information was verified with patient.     Was a Nurse, learning disability used for this call? No    Completed refill call assessment today to schedule patient's medication shipment from the [facility] [phone]).  All relevant notes have been reviewed.     Specialty medication(s) and dose(s) confirmed: Regimen is correct and unchanged.   Changes to medications: Helen [facility] reports no changes at this time.  Changes to insurance: No  New side effects reported not previously addressed with a pharmacist or physician: None reported  Questions for the pharmacist: No    Confirmed patient received a Conservation officer, historic buildings and a Surveyor, mining with first shipment. The patient will receive a drug information handout for each medication shipped and additional FDA Medication Guides as required.       DISEASE/MEDICATION-SPECIFIC INFORMATION        N/A    SPECIALTY MEDICATION ADHERENCE     Medication Adherence    Patient reported X missed doses in the last month: 0  Specialty Medication: XELJANZ 5 mg Tab  Patient is on additional specialty medications: No              Were doses missed due to medication being on hold? No    Xeljanz 5 mg: 3 days of medicine on hand        REFERRAL TO PHARMACIST     Referral to the pharmacist: Not needed      [facility]     Shipping address confirmed in Epic.     Delivery Scheduled: Yes, Expected medication delivery date: 05/08/22.  However, Rx request for refills was sent to the provider as there are none remaining.     Medication will be delivered via Same Day Courier to the prescription address in Epic WAM.    [facility]   [facility] Specialty Technician

## 2022-05-12 NOTE — Unmapped (Signed)
[facility] 's XELJANZ 5 mg Tab tablet (tofacitinib) shipment will be canceled  as a result of no refills remain on the prescription.      I have reached out to the patient  at [phone] and left a voicemail message.  We will not reschedule the medication and have removed this/these medication(s) from the work request.  We have canceled this work request.     Note: Provider denied refill request, no reason was provided. LVM for patient to contact provider.

## 2022-05-18 DIAGNOSIS — Z760 Encounter for issue of repeat prescription: Principal | ICD-10-CM

## 2022-05-18 DIAGNOSIS — K5100 Ulcerative (chronic) pancolitis without complications: Principal | ICD-10-CM

## 2022-05-18 DIAGNOSIS — K5010 Crohn's disease of large intestine without complications: Principal | ICD-10-CM

## 2022-05-18 MED ORDER — XELJANZ 5 MG TABLET
ORAL_TABLET | Freq: Two times a day (BID) | ORAL | 0 refills | 30 days | Status: CP
Start: 2022-05-18 — End: ?
  Filled 2022-05-25: qty 60, 30d supply, fill #0

## 2022-05-18 NOTE — Unmapped (Signed)
Patient reached out for refill of tofacitnib  Ordered labs at [facility]  Requested clinic appointment  Will provide short term refill            Emilyn Ruble D. Wessie Shanks MD, MPH  Professor of Medicine  [place] [facility] [place] at [facility]

## 2022-05-19 NOTE — Unmapped (Signed)
Thanks! I pushed a PA request now and will follow up with her.  [facility]

## 2022-05-22 NOTE — Unmapped (Signed)
[facility] Refill Coordination Note    Specialty Medication(s) to be Shipped:   Inflammatory Disorders: [facility]    Other medication(s) to be shipped: No additional medications requested for fill at this time     Helen [facility], DOB: 20-Jun-1991  Phone: [phone] (home)       All above HIPAA information was verified with patient.     Was a Nurse, learning disability used for this call? No    Completed refill call assessment today to schedule patient's medication shipment from the [facility] [phone]).  All relevant notes have been reviewed.     Specialty medication(s) and dose(s) confirmed: Regimen is correct and unchanged.   Changes to medications: [facility] reports no changes at this time.  Changes to insurance: No  New side effects reported not previously addressed with a pharmacist or physician: None reported  Questions for the pharmacist: No    Confirmed patient received a Conservation officer, historic buildings and a Surveyor, mining with first shipment. The patient will receive a drug information handout for each medication shipped and additional FDA Medication Guides as required.       DISEASE/MEDICATION-SPECIFIC INFORMATION        N/A    SPECIALTY MEDICATION ADHERENCE     Medication Adherence    Specialty Medication: XELJANZ 5 mg Tab  Patient is on additional specialty medications: No              Were doses missed due to medication being on hold? No    Xeljanz 5 mg: 5 days of medicine on hand       REFERRAL TO PHARMACIST     Referral to the pharmacist: Not needed      [facility]     Shipping address confirmed in Epic.     Delivery Scheduled: Yes, Expected medication delivery date: 05/26/22.     Medication will be delivered via Next Day Courier to the prescription address in Epic WAM.    [facility]   [facility] Specialty Technician

## 2022-05-26 NOTE — Progress Notes (Unsigned)
BH MD/PA/NP OP Progress Note  05/26/2022 5:42 PM Angela Little  MRN:  [id number]  Chief Complaint: No chief complaint on file.  HPI: *** Visit Diagnosis: No diagnosis found.  Past Psychiatric History: Please see initial evaluation for full details. I have reviewed the history. No updates at this time.     Past Medical History:  Past Medical History:  Diagnosis Date   Alopecia    Crohn's disease (HCC)    Mononucleosis 02/19/2012   History 2012   Pharyngitis 02/19/2012   No past surgical history on file.  Family Psychiatric History: Please see initial evaluation for full details. I have reviewed the history. No updates at this time.     Family History:  Family History  Problem Relation Age of Onset   Arthritis Other    Cancer Other        colon cancer   Hyperlipidemia Other    Hypertension Other    Alcohol abuse Cousin     Social History:  Social History   Socioeconomic History   Marital status: Single    Spouse name: Not on file   Number of children: Not on file   Years of education: Not on file   Highest education level: Not on file  Occupational History   Occupation: Social worker  Tobacco Use   Smoking status: Never   Smokeless tobacco: Never  Vaping Use   Vaping Use: Never used  Substance and Sexual Activity   Alcohol use: No   Drug use: No   Sexual activity: Not on file  Other Topics Concern   Not on file  Social History Narrative   Not on file   Social Determinants of Health   Financial Resource Strain: Not on file  Food Insecurity: Not on file  Transportation Needs: Not on file  Physical Activity: Not on file  Stress: Not on file  Social Connections: Not on file    Allergies: No Known Allergies  Metabolic Disorder Labs: No results found for: "HGBA1C", "MPG" No results found for: "PROLACTIN" No results found for: "CHOL", "TRIG", "HDL", "CHOLHDL", "VLDL", "LDLCALC" No results found for: "TSH"  Therapeutic Level Labs: No  results found for: "LITHIUM" No results found for: "VALPROATE" No results found for: "CBMZ"  Current Medications: Current Outpatient Medications  Medication Sig Dispense Refill   Adalimumab (HUMIRA PEN [place]) Inject into the skin once a week.     amphetamine-dextroamphetamine (ADDERALL) 10 MG tablet Take 1 tablet (10 mg total) by mouth 2 (two) times daily with a meal. 60 tablet 0   amphetamine-dextroamphetamine (ADDERALL) 10 MG tablet Take 1 tablet (10 mg total) by mouth 2 (two) times daily with a meal. 60 tablet 0   amphetamine-dextroamphetamine (ADDERALL) 10 MG tablet Take 1 tablet (10 mg total) by mouth 2 (two) times daily with a meal. 60 tablet 0   clonazePAM (KLONOPIN) 0.5 MG tablet Take 1 tablet (0.5 mg total) by mouth 2 (two) times daily as needed for anxiety (sleep). 60 tablet 1   clonazePAM (KLONOPIN) 0.5 MG tablet Take 1 tablet (0.5 mg total) by mouth daily as needed for anxiety. 30 tablet 0   FLUoxetine (PROZAC) 20 MG capsule Take 1 capsule (20 mg total) by mouth daily. Total of 60 mg daily. Take along with 40 mg cap 30 capsule 1   FLUoxetine (PROZAC) 20 MG tablet Take 1 tablet (20 mg total) by mouth daily. 90 tablet 0   METHOTREXATE, ANTI-RHEUMATIC, PO Take by mouth once a week.  No current facility-administered medications for this visit.     Musculoskeletal: Strength & Muscle Tone: within normal limits Gait & Station: normal Patient leans: N/A  Psychiatric Specialty Exam: Review of Systems  There were no vitals taken for this visit.There is no height or weight on file to calculate BMI.  General Appearance: {Appearance:22683}  Eye Contact:  {BHH EYE CONTACT:22684}  Speech:  Clear and Coherent  Volume:  Normal  Mood:  {BHH MOOD:22306}  Affect:  {Affect (PAA):22687}  Thought Process:  Coherent  Orientation:  Full (Time, Place, and Person)  Thought Content: Logical   Suicidal Thoughts:  {ST/HT (PAA):22692}  Homicidal Thoughts:  {ST/HT (PAA):22692}  Memory:   Immediate;   Good  Judgement:  {Judgement (PAA):22694}  Insight:  {Insight (PAA):22695}  Psychomotor Activity:  Normal  Concentration:  Concentration: Good and Attention Span: Good  Recall:  Good  Fund of Knowledge: Good  Language: Good  Akathisia:  No  Handed:  Right  AIMS (if indicated): not done  Assets:  Communication Skills Desire for Improvement  ADL's:  Intact  Cognition: WNL  Sleep:  {BHH GOOD/FAIR/POOR:22877}   Screenings: PHQ2-9    Flowsheet Row Video Visit from 04/15/2022 in [facility]  PHQ-2 Total Score 3  PHQ-9 Total Score 10        Assessment and Plan:  Angela Little is a 31 y.o. 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     1. MDD (major depressive disorder), recurrent episode, moderate (HCC) 2. GAD (generalized anxiety disorder) She reports anxiety and depressive symptoms as described above, although it has been slightly improving since uptitration of fluoxetine/having underwent 3 surgeries in relation to Crohn's colitis.  Psychosocial stressors includes her medical condition of Crohn's colitis/ongoing GI symptoms.  Will uptitrate fluoxetine to optimize treatment for depression and anxiety. She will greatly benefit from CBT.  She will continue to see a therapist.    Plan Increase fluoxitine 60 mg daily (currently takes 40 mg daily) Continue clonazepam 0.5 mg daily as needed for anxiety (she has two refills left from other provider according to PMP database) Next appointment: 6/26 at 1 PM for 30 mins, in person - on [place] - [facility]   The patient demonstrates the following risk factors for suicide: Chronic risk factors for suicide include: psychiatric disorder of depression, anxiety . Acute risk factors for suicide include: N/A. Protective factors for this  patient include: positive social support, coping skills, and hope for the future. Considering these factors, the overall suicide risk at this point appears to be low. Patient is appropriate for outpatient follow up.         Collaboration of Care: Collaboration of Care: {BH OP Collaboration of Care:21014065}  Patient/Guardian was advised Release of Information must be obtained prior to any record release in order to collaborate their care with an outside provider. Patient/Guardian was advised if they have not already done so to contact the registration department to sign all necessary forms in order for [place] to release information regarding their care.   Consent: Patient/Guardian gives verbal consent for treatment and assignment of benefits for services provided during this visit. Patient/Guardian expressed understanding and agreed to proceed.    [name], MD 05/26/2022, 5:42 PM

## 2022-06-01 ENCOUNTER — Ambulatory Visit (INDEPENDENT_AMBULATORY_CARE_PROVIDER_SITE_OTHER): Payer: BC Managed Care – PPO | Admitting: Psychiatry

## 2022-06-01 ENCOUNTER — Other Ambulatory Visit
Admission: RE | Admit: 2022-06-01 | Discharge: 2022-06-01 | Disposition: A | Discharge status: Home / Self Care | Payer: BC Managed Care – PPO | Attending: Psychiatry | Admitting: Psychiatry

## 2022-06-01 ENCOUNTER — Encounter: Payer: Self-pay | Admitting: Psychiatry

## 2022-06-01 VITALS — BP 111/71 | HR 62 | Ht 62.99 in | Wt 117.6 lb

## 2022-06-01 DIAGNOSIS — F411 Generalized anxiety disorder: Secondary | ICD-10-CM

## 2022-06-01 DIAGNOSIS — R5383 Other fatigue: Secondary | ICD-10-CM | POA: Diagnosis present

## 2022-06-01 DIAGNOSIS — F330 Major depressive disorder, recurrent, mild: Secondary | ICD-10-CM

## 2022-06-01 LAB — CBC
HCT: 36.1 % (ref 36.0–46.0)
Hemoglobin: 11.7 g/dL — ABNORMAL LOW (ref 12.0–15.0)
MCH: 31.8 pg (ref 26.0–34.0)
MCHC: 32.4 g/dL (ref 30.0–36.0)
MCV: 98.1 fL (ref 80.0–100.0)
Platelets: 369 10*3/uL (ref 150–400)
RBC: 3.68 MIL/uL — ABNORMAL LOW (ref 3.87–5.11)
RDW: 12.4 % (ref 11.5–15.5)
WBC: 8.3 10*3/uL (ref 4.0–10.5)
nRBC: 0 % (ref 0.0–0.2)

## 2022-06-01 LAB — FERRITIN: Ferritin: 13 ng/mL (ref 11–307)

## 2022-06-01 LAB — TSH: TSH: 2.642 u[IU]/mL (ref 0.350–4.500)

## 2022-06-01 LAB — VITAMIN B12: Vitamin B-12: 370 pg/mL (ref 180–914)

## 2022-06-02 ENCOUNTER — Telehealth: Payer: Self-pay | Admitting: Psychiatry

## 2022-06-02 ENCOUNTER — Encounter: Payer: Self-pay | Admitting: Psychiatry

## 2022-06-03 NOTE — Telephone Encounter (Signed)
Left message to call office back

## 2022-06-16 NOTE — Unmapped (Signed)
I spoke with patient regarding the next shipment of [name] and the patient declined the refill. The patient stated that she needs to have blood work done before the provider's office would send over a new prescription due to no refills remaining. Ms. Naomie Dean asked that I give her a call back next week, I will schedule refill call for 1 week from today.

## 2022-06-27 LAB — CBC W/ DIFFERENTIAL
BANDED NEUTROPHILS ABSOLUTE COUNT: 0 10*3/uL (ref 0.0–0.1)
BASOPHILS ABSOLUTE COUNT: 0.1 10*3/uL (ref 0.0–0.2)
BASOPHILS RELATIVE PERCENT: 1 %
EOSINOPHILS ABSOLUTE COUNT: 0.1 10*3/uL (ref 0.0–0.4)
EOSINOPHILS RELATIVE PERCENT: 1 %
HEMATOCRIT: 36.4 % (ref 34.0–46.6)
HEMOGLOBIN: 12.5 g/dL (ref 11.1–15.9)
IMMATURE GRANULOCYTES: 0 %
LYMPHOCYTES ABSOLUTE COUNT: 1.4 10*3/uL (ref 0.7–3.1)
LYMPHOCYTES RELATIVE PERCENT: 20 %
MEAN CORPUSCULAR HEMOGLOBIN CONC: 34.3 g/dL (ref 31.5–35.7)
MEAN CORPUSCULAR HEMOGLOBIN: 32.2 pg (ref 26.6–33.0)
MEAN CORPUSCULAR VOLUME: 94 fL (ref 79–97)
MONOCYTES ABSOLUTE COUNT: 0.5 10*3/uL (ref 0.1–0.9)
MONOCYTES RELATIVE PERCENT: 8 %
NEUTROPHILS ABSOLUTE COUNT: 5 10*3/uL (ref 1.4–7.0)
NEUTROPHILS RELATIVE PERCENT: 70 %
PLATELET COUNT: 371 10*3/uL (ref 150–450)
RED BLOOD CELL COUNT: 3.88 x10E6/uL (ref 3.77–5.28)
RED CELL DISTRIBUTION WIDTH: 12.1 % (ref 11.7–15.4)
WHITE BLOOD CELL COUNT: 7.1 10*3/uL (ref 3.4–10.8)

## 2022-06-27 LAB — HEPATIC FUNCTION PANEL
ALBUMIN: 4.6 g/dL (ref 4.0–5.0)
ALKALINE PHOSPHATASE: 158 IU/L — ABNORMAL HIGH (ref 44–121)
ALT (SGPT): 40 IU/L — ABNORMAL HIGH (ref 0–32)
AST (SGOT): 35 IU/L (ref 0–40)
BILIRUBIN DIRECT: 0.11 mg/dL (ref 0.00–0.40)
BILIRUBIN TOTAL (MG/DL) IN SER/PLAS: 0.3 mg/dL (ref 0.0–1.2)
TOTAL PROTEIN: 7.6 g/dL (ref 6.0–8.5)

## 2022-06-27 LAB — BASIC METABOLIC PANEL
BLOOD UREA NITROGEN: 7 mg/dL (ref 6–20)
BUN / CREAT RATIO: 10 (ref 9–23)
CALCIUM: 9.5 mg/dL (ref 8.7–10.2)
CHLORIDE: 103 mmol/L (ref 96–106)
CO2: 23 mmol/L (ref 20–29)
CREATININE: 0.67 mg/dL (ref 0.57–1.00)
EGFR: 121 mL/min/{1.73_m2}
GLUCOSE: 95 mg/dL (ref 70–99)
POTASSIUM: 4.5 mmol/L (ref 3.5–5.2)
SODIUM: 139 mmol/L (ref 134–144)

## 2022-06-27 LAB — IRON & TIBC
IRON SATURATION: 20 % (ref 15–55)
IRON: 71 ug/dL (ref 27–159)
TOTAL IRON BINDING CAPACITY: 348 ug/dL (ref 250–450)
UNSATURATED IRON BINDING CAPACITY: 277 ug/dL (ref 131–425)

## 2022-06-27 LAB — C-REACTIVE PROTEIN: C-REACTIVE PROTEIN: 4 mg/L (ref 0–10)

## 2022-06-27 LAB — FERRITIN: FERRITIN: 18 ng/mL (ref 15–150)

## 2022-07-02 DIAGNOSIS — K5100 Ulcerative (chronic) pancolitis without complications: Principal | ICD-10-CM

## 2022-07-02 MED ORDER — XELJANZ 5 MG TABLET
ORAL_TABLET | Freq: Two times a day (BID) | ORAL | 0 refills | 30 days
Start: 2022-07-02 — End: ?

## 2022-07-02 NOTE — Unmapped (Signed)
[facility] Refill Coordination Note    Specialty Medication(s) to be Shipped:   Inflammatory Disorders: [name]    Other medication(s) to be shipped: No additional medications requested for fill at this time     Helen [facility], DOB: 06/14/1991  Phone: [phone] (home)       All above HIPAA information was verified with patient.     Was a Nurse, learning disability used for this call? No    Completed refill call assessment today to schedule patient's medication shipment from the [facility] [phone]).  All relevant notes have been reviewed.     Specialty medication(s) and dose(s) confirmed: Regimen is correct and unchanged.   Changes to medications: Helen [facility] reports no changes at this time.  Changes to insurance: No  New side effects reported not previously addressed with a pharmacist or physician: None reported  Questions for the pharmacist: No    Confirmed patient received a Conservation officer, historic buildings and a Surveyor, mining with first shipment. The patient will receive a drug information handout for each medication shipped and additional FDA Medication Guides as required.       DISEASE/MEDICATION-SPECIFIC INFORMATION        N/A    SPECIALTY MEDICATION ADHERENCE     Medication Adherence    Specialty Medication: XELJANZ 5 mg Tab  Patient is on additional specialty medications: No              Were doses missed due to medication being on hold? No    Xeljanz 5 mg: 0 days of medicine on hand         REFERRAL TO PHARMACIST     Referral to the pharmacist: Not needed      [facility]     Shipping address confirmed in Epic.     Delivery Scheduled: Yes, Expected medication delivery date: 07/06/22.  However, Rx request for refills was sent to the provider as there are none remaining.     Medication will be delivered via Same Day Courier to the prescription address in Epic WAM.    Willette Pa   [facility] Specialty Technician

## 2022-07-06 MED ORDER — XELJANZ 5 MG TABLET
ORAL_TABLET | Freq: Two times a day (BID) | ORAL | 0 refills | 30 days | Status: CP
Start: 2022-07-06 — End: ?
  Filled 2022-07-13: qty 60, 30d supply, fill #0

## 2022-07-06 NOTE — Unmapped (Signed)
[facility] 's XELJANZ 5 mg Tab tablet (tofacitinib) shipment will be delayed as a result of no refills remain on the prescription.      I have reached out to the patient  at [phone] and communicated the delay. We will call the patient back to reschedule the delivery upon resolution. We have not confirmed the new delivery date.

## 2022-07-09 NOTE — Unmapped (Signed)
[facility] 's [name] shipment will be canceled  as a result of unable to reach patient to reschedule.    I have reached out to the patient  at [phone] and left a voicemail message.  We will not reschedule the medication and have removed this/these medication(s) from the work request.  We have canceled this work request.     Outreach attempts made 8/1, 8/2 and 8/3.

## 2022-07-10 NOTE — Unmapped (Signed)
Spoke with patient. [facility] 5mg  is now scheduled to be delivered on 8/7 to prescription address via same day courier. No questions for the pharmacist.

## 2022-07-14 DIAGNOSIS — K5100 Ulcerative (chronic) pancolitis without complications: Principal | ICD-10-CM

## 2022-07-14 MED ORDER — XELJANZ 5 MG TABLET
ORAL_TABLET | Freq: Two times a day (BID) | ORAL | 0 refills | 30 days
Start: 2022-07-14 — End: ?

## 2022-07-16 MED ORDER — XELJANZ 5 MG TABLET
ORAL_TABLET | Freq: Two times a day (BID) | ORAL | 0 refills | 30 days | Status: CP
Start: 2022-07-16 — End: ?
  Filled 2022-08-14: qty 60, 30d supply, fill #0

## 2022-07-22 ENCOUNTER — Telehealth: Payer: BC Managed Care – PPO | Admitting: Psychiatry

## 2022-08-04 ENCOUNTER — Ambulatory Visit
Admit: 2022-08-04 | Discharge: 2022-08-05 | Payer: PRIVATE HEALTH INSURANCE | Attending: Internal Medicine | Primary: Internal Medicine

## 2022-08-04 DIAGNOSIS — K5010 Crohn's disease of large intestine without complications: Principal | ICD-10-CM

## 2022-08-04 DIAGNOSIS — K5100 Ulcerative (chronic) pancolitis without complications: Principal | ICD-10-CM

## 2022-08-04 LAB — IRON & TIBC
IRON SATURATION: 34 % (ref 20–55)
IRON: 109 ug/dL
TOTAL IRON BINDING CAPACITY: 324 ug/dL (ref 250–425)

## 2022-08-04 LAB — TSH: THYROID STIMULATING HORMONE: 2.294 u[IU]/mL (ref 0.550–4.780)

## 2022-08-04 LAB — VITAMIN B12: VITAMIN B-12: 611 pg/mL (ref 211–911)

## 2022-08-04 LAB — FERRITIN: FERRITIN: 10.1 ng/mL

## 2022-08-04 MED ORDER — CIPROFLOXACIN 500 MG TABLET
ORAL_TABLET | Freq: Two times a day (BID) | ORAL | 0 refills | 14 days | Status: CP
Start: 2022-08-04 — End: 2022-08-18

## 2022-08-04 NOTE — Unmapped (Signed)
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 year old female with a past medical history significant for alopecia universalis and Crohn's (PSC-IBD) colitis. She has previously failed multiple therapies including:  Remicade, humira, cimzia, mtx, 6mp (including combination therapy), stelara (Cricket dosing off label, no prior infusion), vedolizumab therapy (partial response) then on tofacitinib maintenance at 5 mg PO  BID. Patient also with MRI/MRCP which showed stricturing and beading of the ducts consistent with PSC, liver biopsy also consistent with PSC, without any evidence of autoimmune overlap or DILI.  She underwent surveillance colonoscopy which demonstrated multifocal high grade dysplasia. She has since undergone colectomy with Dr. Drue Dun.    PSC-IBD (s/p total proctocolectomy in 09/2020) who underwent IPAA on 02/20/2021 without operative complications.     MRCP for monitoring 06/2020 - consistent with PSC, no cholangio    Path from resection:  COLON, EXCISION:               Tubulovillous adenoma with focal high-grade dysplasia.               Chronic active colitis, consistent with patient's known history of Crohn's disease.               No invasive carcinoma identified.               Appendix with no histologic abnormality.               117 lymph nodes, negative for carcinoma (0/117).               Interval history:  Does not feel as good as she did prior to surgery but feels better than she expected. With loose BM up to 6-7 times daily (often post-prandial within 30 mins of meals) including 1-2 episodes overnight which prevents her from getting rest. Feels chronically fatigued. She is concerned about loud growling noises in her stomach after meals, feels like her stomach is doing flips. Tries not to eat if she has a meeting to attend as she is worried about the noise. No abd pain, no blood.  Denies urgency or bleeding.    She is now very active riding her 2 hunter jumper horses. Was recently grand champion at a show.    PROBLEMS:  Patient Active Problem List   Diagnosis    Crohn's colitis (CMS-HCC)    Alopecia areata    Chronic rhinitis    Acute tonsillitis    Encounter for gynecological examination (general) (routine) without abnormal findings    Atypical squamous cells of undetermined significance on cytologic smear of cervix (ASC-US)    Iron deficiency         PAST MEDICAL HISTORY:    Past Medical History:   Diagnosis Date    Abnormal LFTs     Alopecia areata totalis     Conjunctivitis     Crohn's disease (CMS-HCC)     HPV (human papilloma virus) infection     has had the HPV vaccine but had an abnormal pap and colpo that was negative.     PSC (primary sclerosing cholangitis)     Sinusitis     Streptococcal sore throat        INFLAMMATORY BOWEL  DISEASE COURSE/PHENOTYPE:    Crohn's disease (PSC-IBD), originally diagnosed in 2008 with ileocolonic inflammatory involvement, biopsies demonstrated chronic active colitis that was patchy in involvement. Colonoscopy 2015, which demonstrated moderate to severe activity in the right colon, with inflammation in the distal terminal ileum.  Prior treatment therapies have included Remicade, Humira, cimzia, methotrexate, 6-MP, stelara and then maintained on vedolizumab monotherapy. MRI/MRCP 2017 with diagnosis of PSC. Colon 08/2016 with pancolitis, path with diffuse mod-severe chronic active colitis. 2018: start of tofacitinib, with clinical response, tapered off of vedolizumab, now on maintenance 5 mg PO BID. Colonoscopy showed multifocal high grade dysplasia in 2021. Colectomy recommended, she has PSC-IBD which is more right sided, with backwash ileitis.    PSC-IBD (s/p total proctocolectomy in 09/2020) who underwent IPAA on 02/20/2021 without operative complications.     Path from resection:  COLON, EXCISION:               Tubulovillous adenoma with focal high-grade dysplasia.               Chronic active colitis, consistent with patient's known history of Crohn's disease.               No invasive carcinoma identified.               Appendix with no histologic abnormality.               117 lymph nodes, negative for carcinoma (0/117).                   ALLERGIES:    Oxycodone    SOCIAL HISTORY: Working in [place],  Forensic scientist.    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      REVIEW OF SYSTEMS:     The balance of 12 systems reviewed is negative except as noted in the HPI.         VITAL SIGNS:    There were no vitals taken for this visit.     Wt Readings from Last 6 Encounters:   09/16/21 51 kg (112 lb 6.4 oz)   05/22/20 54.4 kg (120 lb)   04/16/20 52.7 kg (116 lb 3.2 oz)   09/13/18 60.9 kg (134 lb 3.2 oz)   01/31/18 59.4 kg (131 lb)   01/17/18 58.4 kg (128 lb 11.2 oz)       PHYSICAL EXAM:    Constitutional:   Alert, oriented x 3, no acute distress, well nourished, and well hydrated.   Mental Status:   Thought organized, appropriate affect, pleasantly interactive, not anxious appearing.   HEENT:   PERRL, conjunctiva clear, anicteric, oropharynx clear, neck supple, no LAD.  Respiratory: Clear to auscultation, unlabored breathing.     Cardiac: Euvolemic, regular rate and rhythm, normal S1 and S2, no murmur.     Abdomen: Soft, normal bowel sounds, non-distended, non-tender, no organomegaly or masses. Ostomy site in RLQ intact.     Perianal/Rectal Exam Not performed.     Extremities:   No edema, well perfused.   Musculoskeletal: No joint swelling or tenderness noted, no deformities.     Skin: No rashes, jaundice or skin lesions noted.     Neuro: No focal deficits.           ASSESSMENT:        31 year old white female with PSC-IBD, PSC, psoriasis, and alopecia, s/p colectomy for high grade dysplasia, now with IPAA. She is on tofacitinib for pouchitis ppx. It has also controlled her joint symptoms as well. Today she presents with concerns of fatigue 2/2 nocturnal BMs and post-prandial grumbling noises from her lower abdomen.     We discussed the need for pouch surveillance of her cuff for dysplasia at least every 3 years.     PLAN:          1.  PSC-IBD: continue tofacitinib 5 mg twice daily. Start ciprofloxacin 500 mg BID x 14 days for small intestinal bacterial overgrowth (the tofa has been effective at prevention of pouchitis). Also advised an earlier dinner and take imodium or lomotil before bedtime to reduce frequency of nocturnal BMs so that she can sleep better. Labs today.     2.  Prior Dysplasia - will need pouchoscopy this year and then q 3 years for surveillance, with biopsies of the cuff. Pouchoscopy ordered today given recent takedown.     3. PSC: patient did have MRCP 06/2020 with no malignancy, stable changes of PSC, will plan to repeat this next year    4.  Alopecia: She now has had some improvement with tofacitinib, this is also been reported in the literature indicates report format for alopecia that [name] can be effective. She has regrown her eyelashes and hair on extremities and is pleased with this.    5. Anxiety - continue prozac 40 mg daily, she is doing well with this, she is now also on klonopin and is seeing a psychiatrist    6. Prevention: She is up-to-date on Prevnar 13 as well as PPSV23 vaccine. Needs annual influenza vaccine. Got Pfizer COVID vaccine series. Previously emphasized the need for sunscreen use, as well as screening skin examinations. S/p Shingrix #1 09/2021 (she is on JAK inhibitor), will plan on #2 at next follow up visit    7. Follow up in 6 months.       Patient seen and discussed with attending Dr. Vilma Meckel MD   Gastroenterology Fellow  Pager: [phone]    I saw and evaluated the patient, participating in the key portions of the service.  I reviewed the resident???s note.  I agree with the resident???s findings and plan. Continue tofa, will schedule pouch exam.            Millie D. Long MD, MPH  Professor of Medicine  [place] [facility]        DIAGNOSTIC STUDIES:  I have reviewed all pertinent diagnostic studies, including:    Liver biopsy:  01/2018  A: Liver, core biopsy   - Mild fibrosis portal expansion and positive copper staining in many periportal hepatocytes (see comment)  - Rare spotty  lobular inflammation and rare portal and lobular ceroid laden macrophages    GI Procedures:    Colonoscopy 05/2020    Impression:            - Mayo Score 1 with patchy areas of Mayo 2 in the                          ascending colon and cecum. Targeted surveillence                          biopsies taken with chromoscopy performed. Overall                          markedly improved from prior examination.                         - The examined portion of the ileum was normal.                         - Very mild inflammation was found. This was graded as                          Mayo Score 1 (mild disease), markedly improved                          compared to previous examinations.                         - Anal papilla(e) were hypertrophied.                         - Several biopsies were obtained in the sigmoid colon,                          in the descending colon, in the transverse colon and                          in the ascending colon.                         - Chromoscopy was performed.                         - Biopsies were taken with a cold forceps for                          histology in the rectum, in the sigmoid colon, in the                          descending colon, in the transverse colon, in the                          ascending colon and in the cecum.        Pathology:    08/2016 Colonoscopy       - Preparation of the colon was fair. This was improved  to adequate with lavage.                       - Abnormal perianal exam with anal canal tenderness.                       - Congested, erythematous, hemorrhagic, ulcerated and                        vascular-pattern-decreased mucosa in the entire examined                        colon. Biopsied from the left and right colon for                        surveillance.  Pathology:  A: Colon, ascending, biopsy   - Diffuse moderate to severe chronic active colitis with reactive epithelial atypia, negative for dysplasia  - Ulceration with inflamed granulation tissue  - No CMV viral cytopathic effect or granulomas identified     B: Colon, descending, biopsy   - Diffuse moderate to severe chronic active colitis, negative for dysplasia  - Ulceration with inflamed granulation tissue  - No CMV viral cytopathic effect or granulomas identified    Pathology:  A: Colon, ascending, biopsy  -Moderate chronic active colitis with multiple foci of villiform low to high-grade dysplasia; see comment    B: Colon, transverse, biopsy  -Moderate chronic active colitis with multiple foci of low-grade dysplasia and foci suspicious for high-grade dysplasia; see comment    C: Colon, descending, biopsy  -Mild chronic active colitis with focus of low-grade dysplasia; see comment    D: Colon, rectosigmoid, biopsy  -Mild to moderate chronic active colitis  -No evidence of dysplasia      08/2014 colonoscopy  Impression: - Congested, erythematous and ulcerated mucosa in the   entire examined colon with patchy distrubution.   Biopsied. Consistent with moderate activity of Crohn's   colitis.  - The examined portion of the ileum was normal.  - The distal rectum and anal verge are normal on   retroflexion view.    Pathology:  A: Colon, right, biopsy  - Moderate to severe chronic active colitis with crypt depletion and erosion,  negative for dysplasia  - No granulomas identified    B: Colon, left, biopsy   - Moderate chronic active colitis, negative for dysplasia  - No granulomas identified      2012 colonoscopy:  Impression: - Erythematous and granular mucosa in the rectum. This was biopsied. This was consistent with mild activity of Crohn's disease. - Erythematous, hemorrhagic, inflamed and ulcerated mucosa in the descending colon, in the transverse colon, in the ascending colon and in the cecum. This was biopsied. This was consistent with moderate to severe activity of Crohn's disease. - Congested mucosa in the terminal ileum for 5-10 cm, normal ileum above this level.     Pathology:  A: Small bowel, terminal ileum, biopsy - Severe chronic active enteritis, consistent with IBD, negative for dysplasia - Ulceration with inflamed granulation tissue - No CMV viral cytopathic effect identified B: Colon, right, biopsy - Moderate to severe chronic active colitis, negative for dysplasia - No CMV viral cytopathic effect identified. C: Colon, left, biopsy - Moderate to severe chronic active colitis, negative for dysplasia - No CMV viral cytopathic effect identified.       LIVER BIOPSY   A:  Liver, core biopsy   - Chronic hepatitis, etiology undetermined, grade 1, stage 0 (see comment)     Comment:  The histologic features are those of a low grade chronic hepatitis process  without increased numbers of plasma cells. The differential diagnosis includes  viral and drug etiologies and autoimmune hepatitis is not favored.       Radiology results:    MRCP 06/2020 [facility])    IMPRESSION:   1. Intrahepatic biliary tree can be seen to the periphery with some   irregular beading and narrowing suggested. Findings are suspicious   for primary sclerosing cholangitis as suggested on previous MRI.   Comparison imaging is not available.   2. No focal, suspicious hepatic lesion or sign of dominant   stricture.   3. Suggestion of mild thickening of the ascending colon on coronal   images with relatively ahaustral appearance of the descending colon.   Findings may reflect sequela of inflammatory bowel disease, not well   assessed.           MRI/MRCP 2018  Impression     --Little interval change from 2017, including appearance of the biliary ducts suggestive of PSC.   --Patchy arterial enhancement, probably perfusional, less likely related to acute inflammatory process.         MRI/MRCP 2017    -- Mild multifocal beading / strictures of the bile ducts associated with slight multifocal dilatation in the intrahepatic bile ducts. These findings are suggestive of primary sclerosing cholangitis in this patient with history of Crohn's disease.  --Edema, thickening and enhancement of the transverse and descending colon wall associated with stranding, mesocolic engorgement and associated prominent lymph nodes. These findings are suggestive of acute on chronic inflammation secondary to Crohn's colitis.  --Prominent mesocolic and omental and right lower quadrant lymph nodes. These are likely reactive and secondary to Crohn's disease.   --The gallbladder was hydropic at the time the exam. This could be due to long fasting. No evidence of acute cholecystitis or obstruction at the level of the hepatic ducts.      Laboratory results:    No visits with results within 1 Week(s) from this visit.   Latest known visit with results is:   Telephone on 05/18/2022   Component Date Value Ref Range Status    WBC 06/26/2022 7.1  3.4 - 10.8 x10E3/uL Final    RBC 06/26/2022 3.88  3.77 - 5.28 x10E6/uL Final    HGB 06/26/2022 12.5  11.1 - 15.9 g/dL Final    HCT 16/09/9603 36.4  34.0 - 46.6 % Final    MCV 06/26/2022 94  79 - 97 fL Final    MCH 06/26/2022 32.2  26.6 - 33.0 pg Final    MCHC 06/26/2022 34.3  31.5 - 35.7 g/dL Final    RDW 54/08/8118 12.1  11.7 - 15.4 % Final    Platelet 06/26/2022 371  150 - 450 x10E3/uL Final    Neutrophils % 06/26/2022 70  Not Estab. % Final    Lymphocytes % 06/26/2022 20  Not Estab. % Final    Monocytes % 06/26/2022 8  Not Estab. % Final    Eosinophils % 06/26/2022 1  Not Estab. % Final    Basophils % 06/26/2022 1  Not Estab. % Final    Absolute Neutrophils 06/26/2022 5.0  1.4 - 7.0 x10E3/uL Final    Absolute Lymphocytes 06/26/2022 1.4  0.7 - 3.1 x10E3/uL Final    Absolute Monocytes  06/26/2022 0.5  0.1 - 0.9  x10E3/uL Final    Absolute Eosinophils 06/26/2022 0.1  0.0 - 0.4 x10E3/uL Final    Absolute Basophils  06/26/2022 0.1  0.0 - 0.2 x10E3/uL Final    Immature Granulocytes 06/26/2022 0  Not Estab. % Final    Bands Absolute 06/26/2022 0.0  0.0 - 0.1 x10E3/uL Final    Glucose 06/26/2022 95  70 - 99 mg/dL Final    BUN 16/09/9603 7  6 - 20 mg/dL Final    Creatinine 54/08/8118 0.67  0.57 - 1.00 mg/dL Final    eGFR 14/78/2956 121  >59 mL/min/1.73 Final    BUN/Creatinine Ratio 06/26/2022 10  9 - 23 Final    Sodium 06/26/2022 139  134 - 144 mmol/L Final    Potassium 06/26/2022 4.5  3.5 - 5.2 mmol/L Final    Chloride 06/26/2022 103  96 - 106 mmol/L Final    CO2 06/26/2022 23  20 - 29 mmol/L Final    Calcium 06/26/2022 9.5  8.7 - 10.2 mg/dL Final    Total Protein 06/26/2022 7.6  6.0 - 8.5 g/dL Final    Albumin 21/30/8657 4.6  4.0 - 5.0 g/dL Final    Total Bilirubin 06/26/2022 0.3  0.0 - 1.2 mg/dL Final    Bilirubin, Direct 06/26/2022 0.11  0.00 - 0.40 mg/dL Final    Alkaline Phosphatase 06/26/2022 158 (H)  44 - 121 IU/L Final    AST 06/26/2022 35  0 - 40 IU/L Final    ALT 06/26/2022 40 (H)  0 - 32 IU/L Final    Ferritin 06/26/2022 18  15 - 150 ng/mL Final    TIBC 06/26/2022 348  250 - 450 ug/dL Final    UIBC 84/69/6295 277  131 - 425 ug/dL Final    Iron 28/41/3244 71  27 - 159 ug/dL Final    Iron Saturation (%) 06/26/2022 20  15 - 55 % Final    CRP 06/26/2022 4  0 - 10 mg/L Final

## 2022-08-04 NOTE — Unmapped (Addendum)
Eat dinner earlier (6 pm). Then take 1-2 lomotil tabs (or imodium 2 mg 1-2 tablets) before bedtime.   You can also use lomotil or imodium throughout the day as needed.  Take ciprofloxacin twice a day for 2 weeks to help with stomach gurgling and to decrease total number of bowel movements.   Call [phone] to schedule your pouchoscopy.

## 2022-08-11 LAB — VITAMIN D 25 HYDROXY: VITAMIN D, TOTAL (25OH): 31.5 ng/mL (ref 20.0–80.0)

## 2022-08-12 NOTE — Unmapped (Signed)
[facility] Refill Coordination Note    Specialty Medication(s) to be Shipped:   Inflammatory Disorders: Helen Calhoun    Other medication(s) to be shipped: No additional medications requested for fill at this time     Helen Calhoun, DOB: [birth year]  Phone: [phone] (home)       All above HIPAA information was verified with patient.     Was a Nurse, learning disability used for this call? No    Completed refill call assessment today to schedule patient's medication shipment from the [facility] [phone]).  All relevant notes have been reviewed.     Specialty medication(s) and dose(s) confirmed: Regimen is correct and unchanged.   Changes to medications: [name] reports no changes at this time.  Changes to insurance: No  New side effects reported not previously addressed with a pharmacist or physician: None reported  Questions for the pharmacist: No    Confirmed patient received a Conservation officer, historic buildings and a Surveyor, mining with first shipment. The patient will receive a drug information handout for each medication shipped and additional FDA Medication Guides as required.       DISEASE/MEDICATION-SPECIFIC INFORMATION        N/A    SPECIALTY MEDICATION ADHERENCE     Medication Adherence    Patient reported X missed doses in the last month: 0  Specialty Medication: XELJANZ 5 mg Tab  Patient is on additional specialty medications: No                          Were doses missed due to medication being on hold? No    Xeljanz 5 mg: 2 days of medicine on hand        REFERRAL TO PHARMACIST     Referral to the pharmacist: Not needed      [facility]     Shipping address confirmed in Epic.     Delivery Scheduled: Yes, Expected medication delivery date: 08/14/22.     Medication will be delivered via Same Day Courier to the prescription address in Epic WAM.    Willette Pa   [facility] Specialty Technician

## 2022-08-13 NOTE — Progress Notes (Signed)
Virtual Visit via Video Note  I connected with Angela Little on 08/14/22 at  8:00 AM EDT by a video enabled telemedicine application and verified that I am speaking with the correct person using two identifiers.  Location: Patient: home Provider: office Persons participated in the visit- patient, provider    I discussed the limitations of evaluation and management by telemedicine and the availability of in person appointments. The patient expressed understanding and agreed to proceed.   I discussed the assessment and treatment plan with the patient. The patient was provided an opportunity to ask questions and all were answered. The patient agreed with the plan and demonstrated an understanding of the instructions.   The patient was advised to call back or seek an in-person evaluation if the symptoms worsen or if the condition fails to improve as anticipated.  I provided 31 minutes of non-face-to-face time during this encounter.   Angela Hotter, MD    [facility] MD/PA/NP OP Progress Note  08/14/2022 8:55 AM Angela Little  MRN:  [id number]  Chief Complaint:  Chief Complaint  Patient presents with   Follow-up   HPI:  This is a follow-up appointment for depression and anxiety.  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  She has been doing well at work.  She feels like she is supporting other emotionally.  She also talks about her friend, who is Engineer, site, who calls her often, crying.  She feels she is stuck in her role as a big sister to support others.  She thinks she has been this way as she likes things to be controlled.  She has been working with her therapist this.  She thinks her mood has been even keeled.  She does not cry as much compared to before.  However, she has intense anxiety whenever she gets reminder about the appointments.  This reminds her of the time she  was diagnosed with her medical condition.  She denies any other significant anxiety.  She reports good support from her friends and her family.  She feels better about her physical health.  She has middle insomnia at times due to her going to the bathroom.  She has fair appetite. She feels fatigue.  She denies SI.  She drinks some amount of alcohol on weekend, although she does not think it is out of control.  She verbalized understanding to discuss this more if she were to drink more often.  She denies drug use except she used CBD in the past.  She takes clonazepam a few times since the last visit or anxiety.  Having the medication helps her anxiety.  She would like to stay on the current medication regimen as it.   Visit Diagnosis:    ICD-10-CM   1. MDD (major depressive disorder), recurrent, in partial remission (HCC)  F33.41     2. GAD (generalized anxiety disorder)  F41.1       Past Psychiatric History: Please see initial evaluation for full details. I have reviewed the history. No updates at this time.     Past Medical History:  Past Medical History:  Diagnosis Date   Alopecia    Crohn's disease (HCC)    Mononucleosis 02/19/2012   History 2012   Pharyngitis 02/19/2012   No past surgical history on file.  Family Psychiatric History: Please see initial evaluation for full details. I have reviewed the history. No  updates at this time.     Family History:  Family History  Problem Relation Age of Onset   Arthritis Other    Cancer Other        colon cancer   Hyperlipidemia Other    Hypertension Other    Alcohol abuse Cousin     Social History:  Social History   Socioeconomic History   Marital status: Single    Spouse name: Not on file   Number of children: Not on file   Years of education: Not on file   Highest education level: Not on file  Occupational History   Occupation: Social worker  Tobacco Use   Smoking status: Never   Smokeless tobacco: Never  Vaping  Use   Vaping Use: Never used  Substance and Sexual Activity   Alcohol use: No   Drug use: No   Sexual activity: Not on file  Other Topics Concern   Not on file  Social History Narrative   Not on file   Social Determinants of Health   Financial Resource Strain: Not on file  Food Insecurity: Not on file  Transportation Needs: Not on file  Physical Activity: Not on file  Stress: Not on file  Social Connections: Not on file    Allergies:  Allergies  Allergen Reactions   Oxycodone Itching    Metabolic Disorder Labs: No results found for: "HGBA1C", "MPG" No results found for: "PROLACTIN" No results found for: "CHOL", "TRIG", "HDL", "CHOLHDL", "VLDL", "LDLCALC" Lab Results  Component Value Date   TSH 2.642 06/01/2022    Therapeutic Level Labs: No results found for: "LITHIUM" No results found for: "VALPROATE" No results found for: "CBMZ"  Current Medications: Current Outpatient Medications  Medication Sig Dispense Refill   FLUoxetine (PROZAC) 40 MG capsule Take 1 capsule (40 mg total) by mouth daily. 30 capsule 1   Adalimumab (HUMIRA PEN [place]) Inject into the skin once a week.     amphetamine-dextroamphetamine (ADDERALL) 10 MG tablet Take 1 tablet (10 mg total) by mouth 2 (two) times daily with a meal. 60 tablet 0   clonazePAM (KLONOPIN) 0.5 MG tablet Take 1 tablet (0.5 mg total) by mouth daily as needed for anxiety. 30 tablet 0   clonazePAM (KLONOPIN) 0.5 MG tablet Take 1 tablet (0.5 mg total) by mouth daily as needed for anxiety (sleep). 30 tablet 1   METHOTREXATE, ANTI-RHEUMATIC, PO Take by mouth once a week.     No current facility-administered medications for this visit.     Musculoskeletal: Strength & Muscle Tone:  N/A Gait & Station:  N/A Patient leans: N/A  Psychiatric Specialty Exam: Review of Systems  Psychiatric/Behavioral:  Positive for sleep disturbance. Negative for agitation, behavioral problems, confusion, decreased concentration, dysphoric mood,  hallucinations, self-injury and suicidal ideas. The patient is nervous/anxious. The patient is not hyperactive.   All other systems reviewed and are negative.   There were no vitals taken for this visit.There is no height or weight on file to calculate BMI.  General Appearance: Fairly Groomed  Eye Contact:  Good  Speech:  Clear and Coherent  Volume:  Normal  Mood:   good  Affect:  Appropriate, Congruent, and calm  Thought Process:  Coherent  Orientation:  Full (Time, Place, and Person)  Thought Content: Logical   Suicidal Thoughts:  No  Homicidal Thoughts:  No  Memory:  Immediate;   Good  Judgement:  Good  Insight:  Good  Psychomotor Activity:  Normal  Concentration:  Concentration: Good  and Attention Span: Good  Recall:  Good  Fund of Knowledge: Good  Language: Good  Akathisia:  No  Handed:  Right  AIMS (if indicated): not done  Assets:  Communication Skills Desire for Improvement  ADL's:  Intact  Cognition: WNL  Sleep:  Fair   Screenings: PHQ2-9    [place] Office Visit from 06/01/2022 in [place] Video Visit from 04/15/2022 in [place]  PHQ-2 Total Score 0 3  PHQ-9 Total Score 9 10      [place] Office Visit from 06/01/2022 in [place] No Risk        Assessment and Plan:  Angela Little is a 31 y.o. 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   1. MDD (major depressive disorder), recurrent, in partial remission ([place]) 2. GAD (generalized anxiety disorder) She reports overall improvement in depressive symptoms and anxiety since the last visit. Psychosocial stressors includes having undergone 3 surgeries in relation to Crohn's colitis, ongoing GI symptoms although it has been improving.  Although she  continues to have occasional episodes of intense anxiety, she was comfortable to stay on the current dose of fluoxetine.  Discussed option of switching or adding medication if any worsening in her symptoms given her concern of fatigue from fluoxetine.  Will continue current dose to target depression and anxiety.  She will continue to see a therapist.   # Fatigue She has borderline anemia, and ferritin level was normal.  She agrees to discuss this with her provider for further intervention/treatment.    Plan Continue fluoxitine 40 mg daily  Continue clonazepam 0.5 mg daily as needed for anxiety  Next appointment:  11/3 at 8 AM, video  - on xeljanz - [facility]   The patient demonstrates the following risk factors for suicide: Chronic risk factors for suicide include: psychiatric disorder of depression, anxiety . Acute risk factors for suicide include: N/A. Protective factors for this patient include: positive social support, coping skills, and hope for the future. Considering these factors, the overall suicide risk at this point appears to be low. Patient is appropriate for outpatient follow up.           Collaboration of Care: Collaboration of Care: Other reviewed notes in Epic  Patient/Guardian was advised Release of Information must be obtained prior to any record release in order to collaborate their care with an outside provider. Patient/Guardian was advised if they have not already done so to contact the registration department to sign all necessary forms in order for [place] to release information regarding their care.   Consent: Patient/Guardian gives verbal consent for treatment and assignment of benefits for services provided during this visit. Patient/Guardian expressed understanding and agreed to proceed.    Norman Clay, MD 08/14/2022, 8:55 AM

## 2022-08-14 ENCOUNTER — Telehealth (INDEPENDENT_AMBULATORY_CARE_PROVIDER_SITE_OTHER): Payer: BC Managed Care – PPO | Admitting: Psychiatry

## 2022-08-14 ENCOUNTER — Encounter: Payer: Self-pay | Admitting: Psychiatry

## 2022-08-14 DIAGNOSIS — F3341 Major depressive disorder, recurrent, in partial remission: Secondary | ICD-10-CM | POA: Diagnosis not present

## 2022-08-14 DIAGNOSIS — F411 Generalized anxiety disorder: Secondary | ICD-10-CM | POA: Diagnosis not present

## 2022-08-14 DIAGNOSIS — K5100 Ulcerative (chronic) pancolitis without complications: Principal | ICD-10-CM

## 2022-08-14 MED ORDER — XELJANZ 5 MG TABLET
ORAL_TABLET | Freq: Two times a day (BID) | ORAL | 0 refills | 30 days
Start: 2022-08-14 — End: ?

## 2022-08-14 MED ORDER — FLUOXETINE HCL 40 MG PO CAPS
40.0000 mg | ORAL_CAPSULE | Freq: Every day | ORAL | 1 refills | Status: DC
Start: 2022-08-14 — End: 2022-11-13

## 2022-08-14 MED ORDER — CLONAZEPAM 0.5 MG PO TABS: 0.5000 mg | ORAL_TABLET | Freq: Every day | ORAL | 1 refills | Status: AC | PRN

## 2022-08-14 NOTE — Patient Instructions (Signed)
Continue fluoxitine 40 mg daily  Continue clonazepam 0.5 mg daily as needed for anxiety  Next appointment:  11/3 at 8 AM

## 2022-08-19 MED ORDER — XELJANZ 5 MG TABLET
ORAL_TABLET | Freq: Two times a day (BID) | ORAL | 0 refills | 30 days | Status: CP
Start: 2022-08-19 — End: ?
  Filled 2022-09-29: qty 60, 30d supply, fill #0

## 2022-08-19 NOTE — Unmapped (Signed)
Helen Calhoun refill authorized X1. Labs up to date July 2023.  Repeat due October 2023 and Long appt has not been scheduled. GIS will reach out to patient again.  Mychart message sent to patient today.

## 2022-09-04 DIAGNOSIS — D500 Iron deficiency anemia secondary to blood loss (chronic): Principal | ICD-10-CM

## 2022-09-04 NOTE — Unmapped (Signed)
Per Dr. Jacqulyn Bath due to signs of iron deficiency, therapy plan placed for Infed 1000mg , premeds 650mg  PO acetaminophen, 50mg  PO diphenhydramine.  Will schedule once auth in place.

## 2022-09-05 DIAGNOSIS — E611 Iron deficiency: Principal | ICD-10-CM

## 2022-09-05 DIAGNOSIS — D500 Iron deficiency anemia secondary to blood loss (chronic): Principal | ICD-10-CM

## 2022-09-22 NOTE — Unmapped (Signed)
Approval in place but per TIC, patient declined to schedule.  Dr. Jacqulyn Bath notified.     Called patient on 10/17 and she does want to proceed, at time of previous call she was out of town and unable to coordinate at that time.  She was encouraged to schedule now, [facility]IC number in mychart message if needed. Patient verbalized understanding and in agreement with plan.

## 2022-09-24 NOTE — Unmapped (Signed)
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     Was a Nurse, learning disability used for this call? No    Specialty Medication(s):   Inflammatory Disorders: [name]     Current Outpatient Medications   Medication Sig Dispense Refill    clonazePAM (KLONOPIN) 0.5 MG tablet Take 1 tablet (0.5 mg total) by mouth nightly as needed for anxiety. 30 tablet 3    dextroamphetamine-amphetamine (ADDERALL) 10 mg tablet Take 10 mg by mouth.      diphenoxylate-atropine (LOMOTIL) 2.5-0.025 mg per tablet 2 tabs PO QID prn 240 tablet 3    FLUoxetine (PROZAC) 40 MG capsule Take 1 capsule (40 mg total) by mouth daily. 90 capsule 3    melatonin 3 mg Tab Take 6 mg by mouth.      tofacitinib (XELJANZ) 5 mg Tab tablet Take 1 tablet (5 mg total) by mouth Two (2) times a day. 60 tablet 0     No current facility-administered medications for this visit.        Changes to medications: Helen Calhoun reports no changes at this time.    Allergies   Allergen Reactions    Oxycodone Itching       Changes to allergies: No    SPECIALTY MEDICATION ADHERENCE     Xeljanz 5 mg: ~7 days of medicine on hand   Medication Adherence    Patient reported X missed doses in the last month: 0  Specialty Medication: [name] 5mg  tablet - 1 bid  Patient is on additional specialty medications: No  Patient is on more than two specialty medications: No  Any gaps in refill history greater than 2 weeks in the last 3 months: no  Demonstrates understanding of importance of adherence: yes  Informant: patient                            Specialty medication(s) dose(s) confirmed: Regimen is correct and unchanged.     Are there any concerns with adherence? No    Adherence counseling provided? Not needed    CLINICAL MANAGEMENT AND INTERVENTION      Clinical Benefit Assessment:    Do you feel the medicine is effective or helping your condition? Yes    Clinical Benefit counseling provided? Not needed    Adverse Effects Assessment:    Are you experiencing any side effects? No    Are you experiencing difficulty administering your medicine? No    Quality of Life Assessment:    Quality of Life    Rheumatology  Oncology  Dermatology  Cystic Fibrosis          How many days over the past month did your ulcerative pancolitis  keep you from your normal activities? For example, brushing your teeth or getting up in the morning. 0    Have you discussed this with your provider? Not needed    Acute Infection Status:    Acute infections noted within Epic:  No active infections  Patient reported infection: None    Therapy Appropriateness:    Is therapy appropriate and patient progressing towards therapeutic goals? Yes, therapy is appropriate and should be continued    DISEASE/MEDICATION-SPECIFIC INFORMATION  N/A    Chronic Inflammatory Diseases: Have you experienced any flares in the last month? No  Has this been reported to your provider? N/A    PATIENT SPECIFIC NEEDS     Does the patient have any physical, cognitive, or cultural barriers? No    Is the patient high risk? No    Did the patient require a clinical intervention? No    Does the patient require physician intervention or other additional services (i.e., nutrition, smoking cessation, social work)? No    SOCIAL DETERMINANTS OF HEALTH     At the [facility], we have learned that life circumstances - like trouble affording food, housing, utilities, or transportation can affect the health of many of our patients.   That is why we wanted to ask: are you currently experiencing any life circumstances that are negatively impacting your health and/or quality of life? No    Social Determinants of Psychologist, prison and probation services Strain: Not on file   Internet Connectivity: Not on file   Food Insecurity: Not on file   Tobacco Use: Low Risk  (08/04/2022)    Patient History     Smoking Tobacco Use: Never     Smokeless Tobacco Use: Never     Passive Exposure: Not on file   Housing/Utilities: Not on file   Alcohol Use: Not on file   Transportation Needs: Not on file   Substance Use: Not on file   Health Literacy: Not on file   Physical Activity: Not on file   Interpersonal Safety: Not on file   Stress: Not on file   Intimate Partner Violence: Not on file   Depression: Not on file   Social Connections: Not on file       Would you be willing to receive help with any of the needs that you have identified today? Not applicable       SHIPPING     Specialty Medication(s) to be Shipped:   Inflammatory Disorders: [name]    Other medication(s) to be shipped: No additional medications requested for fill at this time     Changes to insurance: No    Delivery Scheduled: Yes, Expected medication delivery date: 09/29/2022.     Medication will be delivered via Same Day Courier to the confirmed prescription address in [facility].    The patient will receive a drug information handout for each medication shipped and additional FDA Medication Guides as required.  Verified that patient has previously received a Conservation officer, historic buildings and a Surveyor, mining.    The patient or caregiver noted above participated in the development of this care plan and knows that they can request review of or adjustments to the care plan at any time.      All of the patient's questions and concerns have been addressed.    Teofilo Pod, PharmD   [facility] Specialty Pharmacist

## 2022-10-01 DIAGNOSIS — K5100 Ulcerative (chronic) pancolitis without complications: Principal | ICD-10-CM

## 2022-10-01 MED ORDER — XELJANZ 5 MG TABLET
ORAL_TABLET | Freq: Two times a day (BID) | ORAL | 0 refills | 30 days
Start: 2022-10-01 — End: ?

## 2022-10-02 MED ORDER — XELJANZ 5 MG TABLET
ORAL_TABLET | Freq: Two times a day (BID) | ORAL | 0 refills | 30 days | Status: CP
Start: 2022-10-02 — End: ?

## 2022-10-02 NOTE — Unmapped (Signed)
Refill request for Helen Calhoun authorized for a one month supply. Labs due. MyChart reminder sent to patient.

## 2022-10-06 DIAGNOSIS — K5010 Crohn's disease of large intestine without complications: Principal | ICD-10-CM

## 2022-10-06 DIAGNOSIS — D500 Iron deficiency anemia secondary to blood loss (chronic): Principal | ICD-10-CM

## 2022-10-09 ENCOUNTER — Telehealth: Payer: BC Managed Care – PPO | Admitting: Psychiatry

## 2022-10-13 ENCOUNTER — Ambulatory Visit: Admit: 2022-10-13 | Discharge: 2022-10-14 | Payer: PRIVATE HEALTH INSURANCE

## 2022-10-13 MED ADMIN — iron dextran (INFED) 25 mg in sodium chloride (NS) 0.9 % 25 mL IVPB: 25 mg | INTRAVENOUS | @ 13:00:00 | Stop: 2022-10-13

## 2022-10-13 MED ADMIN — iron dextran (INFED) 1,000 mg in sodium chloride (NS) 0.9 % 250 mL IVPB: 1000 mg | INTRAVENOUS | @ 14:00:00 | Stop: 2022-10-13

## 2022-10-13 MED ADMIN — acetaminophen (TYLENOL) tablet 650 mg: 650 mg | ORAL | @ 13:00:00 | Stop: 2022-10-13

## 2022-10-13 NOTE — Unmapped (Signed)
Patient reports to [facility]fusion Clinic for INFED (Iron Dextran) infusion today. Patient denies any recent infection or fever, VSS, medications and allergies reviewed and up to date. Patient educated regarding the medication, possible side effects to watch for, possibility of infusion reaction, and other reference information regarding the drug. Patient verbalizes understanding. Premedications administered. Patient declined to take Benadryl because she has to work this afternoon. PIV initiated to [facility] without difficulty. Call bell placed at patients side with instructions on how to use, verbalized understanding.     9562 Iron Dextran 25mg  Test Dose started per Infusion Clinic protocol.    Patient tolerated first test dose of Iron Dextran without any ill effects, VSS and comparable to baseline.    1308 Iron Dextran 1000mg  started per provider order.    6578 Iron Dextran infusion complete. Patient tolerated infusion well without any ill effects, VSS, PIV flushed and capped for 30 minute post infusion observation. Patient tolerated post infusion observation with no ill effects, VSS, PIV removed per clinic protocol, gauze and coban applied, patient discharged from Infusion Clinic in no acute distress.

## 2022-10-23 DIAGNOSIS — K8301 Primary sclerosing cholangitis: Principal | ICD-10-CM

## 2022-11-09 NOTE — Unmapped (Addendum)
[facility] Refill Coordination Note    Ms. Helen Calhoun is aware to complete labs for [facility] safety monitoring. I informed her labs orders were sent to [facility] lab for her to complete at her earliest convenience. She verbalized understanding.     Specialty Medication(s) to be Shipped:   Inflammatory Disorders: [name]    Other medication(s) to be shipped: No additional medications requested for fill at this time     Helen Calhoun, DOB: [birth year]  Phone: [phone] (home)       All above HIPAA information was verified with patient.     Was a Nurse, learning disability used for this call? No    Completed refill call assessment today to schedule patient's medication shipment from the [facility] [phone]).  All relevant notes have been reviewed.     Specialty medication(s) and dose(s) confirmed: Regimen is correct and unchanged.   Changes to medications: Helen Calhoun reports no changes at this time.  Changes to insurance: No  New side effects reported not previously addressed with a pharmacist or physician: None reported  Questions for the pharmacist: No    Confirmed patient received a Conservation officer, historic buildings and a Surveyor, mining with first shipment. The patient will receive a drug information handout for each medication shipped and additional FDA Medication Guides as required.       DISEASE/MEDICATION-SPECIFIC INFORMATION        N/A    SPECIALTY MEDICATION ADHERENCE     Medication Adherence    Patient reported X missed doses in the last month: 3  Specialty Medication: XELJANZ 5 mg Tab  Patient is on additional specialty medications: No  Patient is on more than two specialty medications: No  Any gaps in refill history greater than 2 weeks in the last 3 months: no  Demonstrates understanding of importance of adherence: yes  Informant: patient                            Were doses missed due to medication being on hold? No    Xeljanz 5 mg: 0 days of medicine on hand REFERRAL TO PHARMACIST     Referral to the pharmacist: Not needed      [facility]     Shipping address confirmed in Epic.     Delivery Scheduled: Yes, Expected medication delivery date: 11/10/22.     Medication will be delivered via Same Day Courier to the prescription address in Epic WAM.    Oliva Bustard, PharmD   [facility] Specialty Pharmacist

## 2022-11-10 NOTE — Progress Notes (Addendum)
Virtual Visit via Video Note  I connected with Angela Little on 11/13/22 at  9:30 AM EST by a video enabled telemedicine application and verified that I am speaking with the correct person using two identifiers.  Location: Patient: home Provider: office Persons participated in the visit- patient, provider    I discussed the limitations of evaluation and management by telemedicine and the availability of in person appointments. The patient expressed understanding and agreed to proceed.      I discussed the assessment and treatment plan with the patient. The patient was provided an opportunity to ask questions and all were answered. The patient agreed with the plan and demonstrated an understanding of the instructions.   The patient was advised to call back or seek an in-person evaluation if the symptoms worsen or if the condition fails to improve as anticipated.  I provided 17 minutes of non-face-to-face time during this encounter.   [name], MD    [facility] MD/PA/NP OP Progress Note  11/13/2022 10:04 AM Summar Angela Little  MRN:  [id number]  Chief Complaint:  Chief Complaint  Patient presents with   Follow-up   HPI:  This is a follow-up appointment for depression and anxiety.  She states that she has been doing well.  She had 1 iron infusion.  She feels a little better in terms of her fatigue.  She has been seeing a therapist.  Although she has to contact offices and pharmacy in relation to change in her insurance, she has not been feeling much overwhelmed as before. She feels good talking about the past, thinking that she was doing worse than she thought that time. She feels relaxed at work.,  although she will be busy in January.  She had a good Thanksgiving with her family.  She has been social with her friends.  She denies feeling depressed.  She denies insomnia.  She denies change in appetite.  She denies SI.  She denies panic attacks.  She has not taken clonazepam  for the past, and finds it helpful to have this medication as a backup, although she feels good not taking this medication lately.  She drinks 2-5 drinks of alcohol, mostly on weekend.  She denies drug use.  She feels comfortable to stay at the current medication regimen.    Visit Diagnosis:    ICD-10-CM   1. MDD (major depressive disorder), recurrent, in partial remission (HCC)  F33.41     2. GAD (generalized anxiety disorder)  F41.1       Past Psychiatric History: Please see initial evaluation for full details. I have reviewed the history. No updates at this time.     Past Medical History:  Past Medical History:  Diagnosis Date   Alopecia    Crohn's disease (HCC)    Mononucleosis 02/19/2012   History 2012   Pharyngitis 02/19/2012   No past surgical history on file.  Family Psychiatric History: Please see initial evaluation for full details. I have reviewed the history. No updates at this time.     Family History:  Family History  Problem Relation Age of Onset   Arthritis Other    Cancer Other        colon cancer   Hyperlipidemia Other    Hypertension Other    Alcohol abuse Cousin     Social History:  Social History   Socioeconomic History   Marital status: Single    Spouse name: Not on file   Number of children: Not on file  Years of education: Not on file   Highest education level: Not on file  Occupational History   Occupation: Social worker  Tobacco Use   Smoking status: Never   Smokeless tobacco: Never  Vaping Use   Vaping Use: Never used  Substance and Sexual Activity   Alcohol use: No   Drug use: No   Sexual activity: Not on file  Other Topics Concern   Not on file  Social History Narrative   Not on file   Social Determinants of Health   Financial Resource Strain: Not on file  Food Insecurity: Not on file  Transportation Needs: Not on file  Physical Activity: Not on file  Stress: Not on file  Social Connections: Not on file     Allergies:  Allergies  Allergen Reactions   Oxycodone Itching    Metabolic Disorder Labs: No results found for: "HGBA1C", "MPG" No results found for: "PROLACTIN" No results found for: "CHOL", "TRIG", "HDL", "CHOLHDL", "VLDL", "LDLCALC" Lab Results  Component Value Date   TSH 2.642 06/01/2022    Therapeutic Level Labs: No results found for: "LITHIUM" No results found for: "VALPROATE" No results found for: "CBMZ"  Current Medications: Current Outpatient Medications  Medication Sig Dispense Refill   Adalimumab (HUMIRA PEN [place]) Inject into the skin once a week.     amphetamine-dextroamphetamine (ADDERALL) 10 MG tablet Take 1 tablet (10 mg total) by mouth 2 (two) times daily with a meal. 60 tablet 0   clonazePAM (KLONOPIN) 0.5 MG tablet Take 1 tablet (0.5 mg total) by mouth daily as needed for anxiety. 30 tablet 0   clonazePAM (KLONOPIN) 0.5 MG tablet Take 1 tablet (0.5 mg total) by mouth daily as needed for anxiety (sleep). 30 tablet 1   FLUoxetine (PROZAC) 40 MG capsule Take 1 capsule (40 mg total) by mouth daily. 90 capsule 0   METHOTREXATE, ANTI-RHEUMATIC, PO Take by mouth once a week.     No current facility-administered medications for this visit.     Musculoskeletal: Strength & Muscle Tone:  N/A Gait & Station:  N/A Patient leans: N/A  Psychiatric Specialty Exam: Review of Systems  Psychiatric/Behavioral:  Positive for sleep disturbance. Negative for agitation, behavioral problems, confusion, decreased concentration, dysphoric mood, hallucinations, self-injury and suicidal ideas. The patient is nervous/anxious. The patient is not hyperactive.   All other systems reviewed and are negative.   There were no vitals taken for this visit.There is no height or weight on file to calculate BMI.  General Appearance: Fairly Groomed  Eye Contact:  Good  Speech:  Clear and Coherent  Volume:  Normal  Mood:   good  Affect:  Appropriate, Congruent, and calm  Thought Process:   Coherent  Orientation:  Full (Time, Place, and Person)  Thought Content: Logical   Suicidal Thoughts:  No  Homicidal Thoughts:  No  Memory:  Immediate;   Good  Judgement:  Good  Insight:  Good  Psychomotor Activity:  Normal  Concentration:  Concentration: Good and Attention Span: Good  Recall:  Good  Fund of Knowledge: Good  Language: Good  Akathisia:  No  Handed:  Right  AIMS (if indicated): not done  Assets:  Communication Skills Desire for Improvement  ADL's:  Intact  Cognition: WNL  Sleep:  Fair   Screenings: PHQ2-9    Flowsheet Row Office Visit from 06/01/2022 in [facility] Video Visit from 04/15/2022 in [facility]  PHQ-2 Total Score 0 3  PHQ-9 Total Score 9 10  Flowsheet Row Office Visit from 06/01/2022 in [facility]  C-SSRS RISK CATEGORY No Risk        Assessment and Plan:  Angela Little is a 31 y.o. 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   1. MDD (major depressive disorder), recurrent, in partial remission (HCC) 2. GAD (generalized anxiety disorder) There has been steady improvement in depressive symptoms and anxiety since the last visit. Psychosocial stressors includes s/p 3 surgeries in relation to Crohn's colitis, and managing her medical condition.  Will continue current dose of fluoxetine to target depression and anxiety.  Will continue clonazepam as needed for anxiety.  She will continue to see her therapist.   # Fatigue She received iron infusion by her GI provider.  She agrees to follow-up for further evaluation/treatment if necessary.    Plan Continue fluoxetine 40 mg daily  Continue clonazepam 0.5 mg daily as needed for anxiety - she has refills left Next appointment:  3/1 at 11 AM for 30 mins, video   - on xeljanz -  [facility]   The patient demonstrates the following risk factors for suicide: Chronic risk factors for suicide include: psychiatric disorder of depression, anxiety . Acute risk factors for suicide include: N/A. Protective factors for this patient include: positive social support, coping skills, and hope for the future. Considering these factors, the overall suicide risk at this point appears to be low. Patient is appropriate for outpatient follow up.       Collaboration of Care: Collaboration of Care: Other reviewed notes in Epic  Patient/Guardian was advised Release of Information must be obtained prior to any record release in order to collaborate their care with an outside provider. Patient/Guardian was advised if they have not already done so to contact the registration department to sign all necessary forms in order for [place] to release information regarding their care.   Consent: Patient/Guardian gives verbal consent for treatment and assignment of benefits for services provided during this visit. Patient/Guardian expressed understanding and agreed to proceed.    [name], MD 11/13/2022, 10:04 AM

## 2022-11-10 NOTE — Unmapped (Signed)
Helen Calhoun 's XELJANZ 5 mg Tab tablet (tofacitinib) shipment will be canceled  as a result of no longer being eligible to fill at [facility] pharmacy.     I have reached out to the patient  at [phone] and communicated the delivery change. We will route a message to clinic staff to inform them of this situation We have canceled this work request.

## 2022-11-10 NOTE — Unmapped (Signed)
The [facility] has received the prescription(s) for XELJANZ 5 mg Tab tablet (tofacitinib). The triage team has completed the benefits investigation and has determined that the patient is NOT able to fill this medication at the [facility] due to insurance plan limitations. Please see additional information below and re-route the prescription to the preferred pharmacy. Thank you.    PA Required: Unable to Determine    Specialty Pharmacy Required:  [facility] - [phone] and Fax: [phone]

## 2022-11-11 DIAGNOSIS — K5010 Crohn's disease of large intestine without complications: Principal | ICD-10-CM

## 2022-11-11 NOTE — Unmapped (Signed)
[facility] orders placed now for xeljanz monitoring. Prior labs entered were for [facility] collection only. Patient reached out to request this be routed to [facility]    Once labs are completed we can route xeljanz refill to [facility] specialty as noted by [facility] as we are no longer able to fill this for her based on insurance requirements.

## 2022-11-13 ENCOUNTER — Encounter: Payer: Self-pay | Admitting: Psychiatry

## 2022-11-13 ENCOUNTER — Telehealth (INDEPENDENT_AMBULATORY_CARE_PROVIDER_SITE_OTHER): Payer: Self-pay | Admitting: Psychiatry

## 2022-11-13 DIAGNOSIS — F411 Generalized anxiety disorder: Secondary | ICD-10-CM

## 2022-11-13 DIAGNOSIS — F3341 Major depressive disorder, recurrent, in partial remission: Secondary | ICD-10-CM

## 2022-11-13 MED ORDER — FLUOXETINE HCL 40 MG PO CAPS: 40.0000 mg | ORAL_CAPSULE | Freq: Every day | ORAL | 0 refills | Status: DC

## 2022-11-13 NOTE — Patient Instructions (Signed)
Continue fluoxetine 40 mg daily  Continue clonazepam 0.5 mg daily as needed for anxiety  Next appointment:  3/1 at 11 AM

## 2022-11-16 NOTE — Unmapped (Signed)
Specialty Medication(s): [name]    Ms.Naomie Dean has been dis-enrolled from the [facility] specialty pharmacy services due to a pharmacy change resulting from insurance limitations. The insurance company requires the patient fill at [facility] .    Additional information provided to the patient: n/a    Teofilo Pod, PharmD  [facility] Specialty Pharmacist

## 2022-11-20 DIAGNOSIS — K5100 Ulcerative (chronic) pancolitis without complications: Principal | ICD-10-CM

## 2022-11-20 MED ORDER — XELJANZ 5 MG TABLET
ORAL_TABLET | Freq: Two times a day (BID) | ORAL | 3 refills | 30 days | Status: CP
Start: 2022-11-20 — End: ?

## 2022-12-02 DIAGNOSIS — R112 Nausea with vomiting, unspecified: Principal | ICD-10-CM

## 2022-12-02 MED ORDER — ONDANSETRON 4 MG DISINTEGRATING TABLET
ORAL_TABLET | Freq: Three times a day (TID) | ORAL | 0 refills | 10 days | Status: CP | PRN
Start: 2022-12-02 — End: 2023-01-01

## 2023-01-30 NOTE — Progress Notes (Addendum)
Virtual Visit via Video Note  I connected with Angela Little on 02/05/23 at 11:00 AM EST by a video enabled telemedicine application and verified that I am speaking with the correct person using two identifiers.  Location: Patient: home Provider: office Persons participated in the visit- patient, provider    I discussed the limitations of evaluation and management by telemedicine and the availability of in person appointments. The patient expressed understanding and agreed to proceed.   I discussed the assessment and treatment plan with the patient. The patient was provided an opportunity to ask questions and all were answered. The patient agreed with the plan and demonstrated an understanding of the instructions.   The patient was advised to call back or seek an in-person evaluation if the symptoms worsen or if the condition fails to improve as anticipated.  I provided 20 minutes of non-face-to-face time during this encounter.   Angela Clay, MD    [facility] MD/PA/NP OP Progress Note  02/05/2023 11:36 AM Angela Little  MRN:  [id number]  Chief Complaint:  Chief Complaint  Patient presents with   Follow-up   HPI:  This is a follow-up appointment for depression and anxiety.  She states that she has been back from work trip.  It was in [place].  She feels that she is in a much better place this year referring to the ostomy she used to have.  She reports good relationship with her family.  She thinks everybody is in a good spot.  She enjoys riding a horse, and went to competition.  She has started to have new friends at work in addition to the core friends she has.  Although she felt loneliness after the holiday, she was able to work through it was her therapist.  She thinks she was bored and was not doing much at home..  She has fair sleep and continues to feel fatigue.  She has dizziness at times.  She took Klonopin when she was on MyChart.  She denies any significant anxiety  otherwise. She has slight difficulty in concentration.  She was drinking 3 glasses of wine a few times per week during the business trip.  She denies drug use.  She thinks clonazepam has been helpful as a security blanket.  She has been taking it much less since uptitration of fluoxetine, and feels comfortable to stay on the current medication regimen.   Support:parents Household: by herself Marital status: single Number of children:0  Employment: full time,  Investment banker, corporate Education:   Last PCP / ongoing medical evaluation:   Visit Diagnosis:    ICD-10-CM   1. MDD (major depressive disorder), recurrent, in partial remission ([place])  F33.41     2. GAD (generalized anxiety disorder)  F41.1     3. Iron deficiency anemia, unspecified iron deficiency anemia type  D50.9       Past Psychiatric History: Please see initial evaluation for full details. I have reviewed the history. No updates at this time.    Past Medical History:  Past Medical History:  Diagnosis Date   Alopecia    Crohn's disease ([place])    Mononucleosis 02/19/2012   History 2012   Pharyngitis 02/19/2012   No past surgical history on file.  Family Psychiatric History: Please see initial evaluation for full details. I have reviewed the history. No updates at this time.     Family History:  Family History  Problem Relation Age of Onset   Arthritis Other    Cancer Other  colon cancer   Hyperlipidemia Other    Hypertension Other    Alcohol abuse Cousin     Social History:  Social History   Socioeconomic History   Marital status: Single    Spouse name: Not on file   Number of children: Not on file   Years of education: Not on file   Highest education level: Not on file  Occupational History   Occupation: Midwife  Tobacco Use   Smoking status: Never   Smokeless tobacco: Never  Vaping Use   Vaping Use: Never used  Substance and Sexual Activity   Alcohol use: No   Drug use: No    Sexual activity: Not on file  Other Topics Concern   Not on file  Social History Narrative   Not on file   Social Determinants of Health   Financial Resource Strain: Not on file  Food Insecurity: Not on file  Transportation Needs: Not on file  Physical Activity: Not on file  Stress: Not on file  Social Connections: Not on file    Allergies:  Allergies  Allergen Reactions   Oxycodone Itching    Metabolic Disorder Labs: No results found for: "HGBA1C", "MPG" No results found for: "PROLACTIN" No results found for: "CHOL", "TRIG", "HDL", "CHOLHDL", "VLDL", "LDLCALC" Lab Results  Component Value Date   TSH 2.642 06/01/2022    Therapeutic Level Labs: No results found for: "LITHIUM" No results found for: "VALPROATE" No results found for: "CBMZ"  Current Medications: Current Outpatient Medications  Medication Sig Dispense Refill   Adalimumab (HUMIRA PEN [place]) Inject into the skin once a week.     amphetamine-dextroamphetamine (ADDERALL) 10 MG tablet Take 1 tablet (10 mg total) by mouth 2 (two) times daily with a meal. 60 tablet 0   clonazePAM (KLONOPIN) 0.5 MG tablet Take 1 tablet (0.5 mg total) by mouth daily as needed for anxiety (sleep). 30 tablet 1   clonazePAM (KLONOPIN) 0.5 MG tablet Take 1 tablet (0.5 mg total) by mouth daily as needed for anxiety. 30 tablet 0   [START ON 02/11/2023] FLUoxetine (PROZAC) 40 MG capsule Take 1 capsule (40 mg total) by mouth daily. 90 capsule 1   METHOTREXATE, ANTI-RHEUMATIC, PO Take by mouth once a week.     No current facility-administered medications for this visit.     Musculoskeletal: Strength & Muscle Tone:  N/A Gait & Station:  N/A Patient leans: N/A  Psychiatric Specialty Exam: Review of Systems  Psychiatric/Behavioral:  Negative for agitation, behavioral problems, confusion, decreased concentration, dysphoric mood, hallucinations, self-injury, sleep disturbance and suicidal ideas. The patient is nervous/anxious. The patient is  not hyperactive.   All other systems reviewed and are negative.   There were no vitals taken for this visit.There is no height or weight on file to calculate BMI.  General Appearance: Fairly Groomed  Eye Contact:  Good  Speech:  Clear and Coherent  Volume:  Normal  Mood:   good  Affect:  Appropriate, Congruent, and Full Range  Thought Process:  Coherent  Orientation:  Full (Time, Place, and Person)  Thought Content: Logical   Suicidal Thoughts:  No  Homicidal Thoughts:  No  Memory:  Immediate;   Good  Judgement:  Good  Insight:  Good  Psychomotor Activity:  Normal  Concentration:  Concentration: Good and Attention Span: Good  Recall:  Good  Fund of Knowledge: Good  Language: Good  Akathisia:  No  Handed:  Right  AIMS (if indicated): not done  Assets:  Communication Skills Desire for Improvement  ADL's:  Intact  Cognition: WNL  Sleep:  Fair   Screenings: [place] Office Visit from 06/01/2022 in [facility] Video Visit from 04/15/2022 in [facility]  PHQ-2 Total Score 0 3  PHQ-9 Total Score 9 10      [place] Office Visit from 06/01/2022 in [facility]  C-SSRS RISK CATEGORY No Risk        Assessment and Plan:  Lacheryl Raney is a 32 y.o. 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   1. MDD (major depressive disorder), recurrent, in partial remission ([place]) 2. GAD (generalized anxiety disorder) Acute stressors include:  Other stressors include: crohn's disease since 6 th grade, s/p 3 surgeries  History:   There has been overall improvement in depressive symptoms and anxiety since the last visit.  Will continue current dose of fluoxetine to target depression and anxiety.  Will continue clonazepam  as needed for anxiety.  She will continue to see a therapist.   # iron deficiency anemia She reports occasional dizziness and fatigue.  She received iron by her GI provider several months ago.  She is advised to contact them for further evaluation and treatment.    Plan Continue fluoxetine 40 mg daily  Continue clonazepam 0.5 mg daily as needed for anxiety - she took only one since the last visit Next appointment:  6/14 at 11 AM for 30 mins, video   - on xeljanz - [facility]   The patient demonstrates the following risk factors for suicide: Chronic risk factors for suicide include: psychiatric disorder of depression, anxiety . Acute risk factors for suicide include: N/A. Protective factors for this patient include: positive social support, coping skills, and hope for the future. Considering these factors, the overall suicide risk at this point appears to be low. Patient is appropriate for outpatient follow up.     Collaboration of Care: Collaboration of Care: Other reviewed notes in Epic  Patient/Guardian was advised Release of Information must be obtained prior to any record release in order to collaborate their care with an outside provider. Patient/Guardian was advised if they have not already done so to contact the registration department to sign all necessary forms in order for [place] to release information regarding their care.   Consent: Patient/Guardian gives verbal consent for treatment and assignment of benefits for services provided during this visit. Patient/Guardian expressed understanding and agreed to proceed.    Angela Clay, MD 02/05/2023, 11:36 AM

## 2023-02-01 DIAGNOSIS — K5010 Crohn's disease of large intestine without complications: Principal | ICD-10-CM

## 2023-02-05 ENCOUNTER — Encounter: Payer: Self-pay | Admitting: Psychiatry

## 2023-02-05 ENCOUNTER — Telehealth (INDEPENDENT_AMBULATORY_CARE_PROVIDER_SITE_OTHER): Payer: Self-pay | Admitting: Psychiatry

## 2023-02-05 DIAGNOSIS — F3341 Major depressive disorder, recurrent, in partial remission: Secondary | ICD-10-CM

## 2023-02-05 DIAGNOSIS — F411 Generalized anxiety disorder: Secondary | ICD-10-CM

## 2023-02-05 DIAGNOSIS — D509 Iron deficiency anemia, unspecified: Secondary | ICD-10-CM

## 2023-02-05 MED ORDER — CLONAZEPAM 0.5 MG PO TABS: 0.5000 mg | ORAL_TABLET | Freq: Every day | ORAL | 0 refills | Status: DC | PRN

## 2023-02-05 MED ORDER — FLUOXETINE HCL 40 MG PO CAPS
40.0000 mg | ORAL_CAPSULE | Freq: Every day | ORAL | 1 refills | Status: DC
Start: 2023-02-11 — End: 2023-06-09

## 2023-02-05 NOTE — Patient Instructions (Signed)
Continue fluoxetine 40 mg daily  Continue clonazepam 0.5 mg daily as needed for anxiety  Next appointment:  6/14 at 11 AM

## 2023-03-16 DIAGNOSIS — K5100 Ulcerative (chronic) pancolitis without complications: Principal | ICD-10-CM

## 2023-03-16 MED ORDER — XELJANZ 5 MG TABLET
ORAL_TABLET | 3 refills | 0 days
Start: 2023-03-16 — End: ?

## 2023-03-17 MED ORDER — XELJANZ 5 MG TABLET
ORAL_TABLET | 0 refills | 0 days | Status: CP
Start: 2023-03-17 — End: ?

## 2023-04-26 DIAGNOSIS — K5010 Crohn's disease of large intestine without complications: Principal | ICD-10-CM

## 2023-05-16 NOTE — Progress Notes (Deleted)
BH MD/PA/NP OP Progress Note  05/16/2023 12:05 PM Manda Shouse  MRN:  [id number]  Chief Complaint: No chief complaint on file.  HPI: *** Visit Diagnosis: No diagnosis found.  Past Psychiatric History: Please see initial evaluation for full details. I have reviewed the history. No updates at this time.     Past Medical History:  Past Medical History:  Diagnosis Date   Alopecia    Crohn's disease (HCC)    Mononucleosis 02/19/2012   History 2012   Pharyngitis 02/19/2012   No past surgical history on file.  Family Psychiatric History: Please see initial evaluation for full details. I have reviewed the history. No updates at this time.     Family History:  Family History  Problem Relation Age of Onset   Arthritis Other    Cancer Other        colon cancer   Hyperlipidemia Other    Hypertension Other    Alcohol abuse Cousin     Social History:  Social History   Socioeconomic History   Marital status: Single    Spouse name: Not on file   Number of children: Not on file   Years of education: Not on file   Highest education level: Not on file  Occupational History   Occupation: Social worker  Tobacco Use   Smoking status: Never   Smokeless tobacco: Never  Vaping Use   Vaping Use: Never used  Substance and Sexual Activity   Alcohol use: No   Drug use: No   Sexual activity: Not on file  Other Topics Concern   Not on file  Social History Narrative   Not on file   Social Determinants of Health   Financial Resource Strain: Not on file  Food Insecurity: Not on file  Transportation Needs: Not on file  Physical Activity: Not on file  Stress: Not on file  Social Connections: Not on file    Allergies:  Allergies  Allergen Reactions   Oxycodone Itching    Metabolic Disorder Labs: No results found for: "HGBA1C", "MPG" No results found for: "PROLACTIN" No results found for: "CHOL", "TRIG", "HDL", "CHOLHDL", "VLDL", "LDLCALC" Lab Results   Component Value Date   TSH 2.642 06/01/2022    Therapeutic Level Labs: No results found for: "LITHIUM" No results found for: "VALPROATE" No results found for: "CBMZ"  Current Medications: Current Outpatient Medications  Medication Sig Dispense Refill   Adalimumab (HUMIRA PEN [place]) Inject into the skin once a week.     amphetamine-dextroamphetamine (ADDERALL) 10 MG tablet Take 1 tablet (10 mg total) by mouth 2 (two) times daily with a meal. 60 tablet 0   clonazePAM (KLONOPIN) 0.5 MG tablet Take 1 tablet (0.5 mg total) by mouth daily as needed for anxiety (sleep). 30 tablet 1   clonazePAM (KLONOPIN) 0.5 MG tablet Take 1 tablet (0.5 mg total) by mouth daily as needed for anxiety. 30 tablet 0   FLUoxetine (PROZAC) 40 MG capsule Take 1 capsule (40 mg total) by mouth daily. 90 capsule 1   METHOTREXATE, ANTI-RHEUMATIC, PO Take by mouth once a week.     No current facility-administered medications for this visit.     Musculoskeletal: Strength & Muscle Tone:  N/A Gait & Station:  N/A Patient leans: N/A  Psychiatric Specialty Exam: Review of Systems  There were no vitals taken for this visit.There is no height or weight on file to calculate BMI.  General Appearance: {Appearance:22683}  Eye Contact:  {B[facility] EYE CONTACT:22684}  Speech:  Clear and Coherent  Volume:  Normal  Mood:  {B[facility] MOOD:22306}  Affect:  {Affect (PAA):22687}  Thought Process:  Coherent  Orientation:  Full (Time, Place, and Person)  Thought Content: Logical   Suicidal Thoughts:  {ST/HT (PAA):22692}  Homicidal Thoughts:  {ST/HT (PAA):22692}  Memory:  Immediate;   Good  Judgement:  {Judgement (PAA):22694}  Insight:  {Insight (PAA):22695}  Psychomotor Activity:  Normal  Concentration:  Concentration: Good and Attention Span: Good  Recall:  Good  Fund of Knowledge: Good  Language: Good  Akathisia:  No  Handed:  Right  AIMS (if indicated): not done  Assets:  Communication Skills Desire for Improvement  ADL's:   Intact  Cognition: WNL  Sleep:  {B[facility] GOOD/FAIR/POOR:22877}   Screenings: [employer] Row Office Visit from 06/01/2022 in [facility] Video Visit from 04/15/2022 in [facility]  PHQ-2 Total Score 0 3  PHQ-9 Total Score 9 10      Flowsheet Row Office Visit from 06/01/2022 in [facility]  C-SSRS RISK CATEGORY No Risk        Assessment and Plan:  Jana Swartzlander is a 32 y.o. 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    1. MDD (major depressive disorder), recurrent, in partial remission (HCC) 2. GAD (generalized anxiety disorder) Acute stressors include:  Other stressors include: crohn's disease since 6 th grade, s/p 3 surgeries  History:   There has been overall improvement in depressive symptoms and anxiety since the last visit.  Will continue current dose of fluoxetine to target depression and anxiety.  Will continue clonazepam as needed for anxiety.  She will continue to see a therapist.    # iron deficiency anemia She reports occasional dizziness and fatigue.  She received iron by her GI provider several months ago.  She is advised to contact them for further evaluation and treatment.    Plan Continue fluoxetine 40 mg daily  Continue clonazepam 0.5 mg daily as needed for anxiety - she took only one since the last visit Next appointment:  6/14 at 11 AM for 30 mins, video   - on xeljanz - [facility]   The patient demonstrates the following risk factors for suicide: Chronic risk factors for suicide include: psychiatric disorder of depression, anxiety . Acute risk factors for suicide include: N/A. Protective factors for this patient include: positive social support, coping skills, and  hope for the future. Considering these factors, the overall suicide risk at this point appears to be low. Patient is appropriate for outpatient follow up.     Collaboration of Care: Collaboration of Care: {BH OP Collaboration of Care:21014065}  Patient/Guardian was advised Release of Information must be obtained prior to any record release in order to collaborate their care with an outside provider. Patient/Guardian was advised if they have not already done so to contact the registration department to sign all necessary forms in order for [place] to release information regarding their care.   Consent: Patient/Guardian gives verbal consent for treatment and assignment of benefits for services provided during this visit. Patient/Guardian expressed understanding and agreed to proceed.    [name], MD 05/16/2023, 12:05 PM

## 2023-05-21 ENCOUNTER — Telehealth: Payer: BC Managed Care – PPO | Admitting: Psychiatry

## 2023-06-05 NOTE — Progress Notes (Unsigned)
Virtual Visit via Video Note  I connected with Angela Little on 06/09/23 at  8:30 AM EDT by a video enabled telemedicine application and verified that I am speaking with the correct person using two identifiers.  Location: Patient: home Provider: office Persons participated in the visit- patient, provider    I discussed the limitations of evaluation and management by telemedicine and the availability of in person appointments. The patient expressed understanding and agreed to proceed.   I discussed the assessment and treatment plan with the patient. The patient was provided an opportunity to ask questions and all were answered. The patient agreed with the plan and demonstrated an understanding of the instructions.   The patient was advised to call back or seek an in-person evaluation if the symptoms worsen or if the condition fails to improve as anticipated.  I provided 21 minutes of non-face-to-face time during this encounter.   [name], MD    [facility] MD/PA/NP OP Progress Note  06/09/2023 9:08 AM Angela Little  MRN:  [id number]  Chief Complaint:  Chief Complaint  Patient presents with   Follow-up   HPI:  This is a follow-up appointment for depression and anxiety.  She states that she has been doing well and she does not want to change her medication.  However, she wanted to inform that she may feel a little numb.  Although she used to cry often, she rarely cries, although she may feel sad on certain movies or things happening to others.  She has been able to enjoy things.  She went to [facility] for a hoarse show.  She states connected with others, and she is looking forward to see her family trip.  She feels calm and in control of herself.  She does not feel anxious anymore, although she used to be worried about ostomy.  She felt laid back when she was traveling.  She denies insomnia.  Although she feels fatigue, she wonders if it might be related to iron  deficiency.  She feels more at peace with her weight, and has been trying to keep healthy diet.  She denies feeling depressed or anxiety.  She denies panic attacks.  She denies SI.  She rarely drinks now that events has been over. She denies drug use. She remembers how she was doing prior to uptiration of fluoxetine, and she feels comfortable to stay on the current dose at this time.   Visit Diagnosis:    ICD-10-CM   1. MDD (major depressive disorder), recurrent, in partial remission (HCC)  F33.41     2. GAD (generalized anxiety disorder)  F41.1     3. Iron deficiency anemia, unspecified iron deficiency anemia type  D50.9       Past Psychiatric History: Please see initial evaluation for full details. I have reviewed the history. No updates at this time.     Past Medical History:  Past Medical History:  Diagnosis Date   Alopecia    Crohn's disease (HCC)    Mononucleosis 02/19/2012   History 2012   Pharyngitis 02/19/2012   History reviewed. No pertinent surgical history.  Family Psychiatric History: Please see initial evaluation for full details. I have reviewed the history. No updates at this time.     Family History:  Family History  Problem Relation Age of Onset   Arthritis Other    Cancer Other        colon cancer   Hyperlipidemia Other    Hypertension Other    Alcohol  abuse Cousin     Social History:  Social History   Socioeconomic History   Marital status: Single    Spouse name: Not on file   Number of children: Not on file   Years of education: Not on file   Highest education level: Not on file  Occupational History   Occupation: Social worker  Tobacco Use   Smoking status: Never   Smokeless tobacco: Never  Vaping Use   Vaping Use: Never used  Substance and Sexual Activity   Alcohol use: No   Drug use: No   Sexual activity: Not on file  Other Topics Concern   Not on file  Social History Narrative   Not on file   Social Determinants of Health    Financial Resource Strain: Not on file  Food Insecurity: Not on file  Transportation Needs: Not on file  Physical Activity: Not on file  Stress: Not on file  Social Connections: Not on file    Allergies:  Allergies  Allergen Reactions   Oxycodone Itching    Metabolic Disorder Labs: No results found for: "HGBA1C", "MPG" No results found for: "PROLACTIN" No results found for: "CHOL", "TRIG", "HDL", "CHOLHDL", "VLDL", "LDLCALC" Lab Results  Component Value Date   TSH 2.642 06/01/2022    Therapeutic Level Labs: No results found for: "LITHIUM" No results found for: "VALPROATE" No results found for: "CBMZ"  Current Medications: Current Outpatient Medications  Medication Sig Dispense Refill   Adalimumab (HUMIRA PEN [place]) Inject into the skin once a week.     clonazePAM (KLONOPIN) 0.5 MG tablet Take 1 tablet (0.5 mg total) by mouth daily as needed for anxiety (sleep). 30 tablet 1   clonazePAM (KLONOPIN) 0.5 MG tablet Take 1 tablet (0.5 mg total) by mouth daily as needed for anxiety. 30 tablet 0   [START ON 08/10/2023] FLUoxetine (PROZAC) 40 MG capsule Take 1 capsule (40 mg total) by mouth daily. 90 capsule 0   METHOTREXATE, ANTI-RHEUMATIC, PO Take by mouth once a week.     No current facility-administered medications for this visit.     Musculoskeletal: Strength & Muscle Tone:  N/A Gait & Station:  N?A Patient leans: N/A  Psychiatric Specialty Exam: Review of Systems  Psychiatric/Behavioral: Negative.    All other systems reviewed and are negative.   There were no vitals taken for this visit.There is no height or weight on file to calculate BMI.  General Appearance: Fairly Groomed  Eye Contact:  Good  Speech:  Clear and Coherent  Volume:  Normal  Mood:   good  Affect:  Appropriate, Congruent, and calm  Thought Process:  Coherent  Orientation:  Full (Time, Place, and Person)  Thought Content: Logical   Suicidal Thoughts:  No  Homicidal Thoughts:  No  Memory:   Immediate;   Good  Judgement:  Good  Insight:  Good  Psychomotor Activity:  Normal  Concentration:  Concentration: Good and Attention Span: Good  Recall:  Good  Fund of Knowledge: Good  Language: Good  Akathisia:  No  Handed:  Right  AIMS (if indicated): not done  Assets:  Communication Skills Desire for Improvement  ADL's:  Intact  Cognition: WNL  Sleep:  Good   Screenings: PHQ2-9    Flowsheet Row Office Visit from 06/01/2022 in [facility] Video Visit from 04/15/2022 in [facility]  PHQ-2 Total Score 0 3  PHQ-9 Total Score 9 10      Flowsheet Row  Office Visit from 06/01/2022 in [facility]  C-SSRS RISK CATEGORY No Risk        Assessment and Plan:  Smith Arms is a 32 y.o. year old female with a history of depression, anxiety, adult ADHD, Crohn's colitis with previous IPAA (ileal pouch anal anastomosis), who presents for follow up appointment for below.   1. MDD (major depressive disorder), recurrent, in full remission (HCC) 2. GAD (generalized anxiety disorder) Acute stressors include:  Other stressors include: crohn's disease since 6 th grade, s/p 3 surgeries  History:  struggles with anxiety her whole life There has been steady improvement in depressive symptoms and anxiety since the last visit.  Discussed the importance of maintenance treatment; according to the chart review, she was struggling more with anxiety around last September, being on the current dose of fluoxetine.  She agrees to stay on the current medication at least for several months to avoid relapsing her mood symptoms.  Noted that she reports some concern of not being able to cry, although she denies any other symptoms consistent with alexithymia.  Will continue clonazepam as needed for anxiety.   3. Iron deficiency anemia, unspecified iron deficiency anemia type She has  chronic fatigue.  She expressed understanding to contact her GI provider for evaluation/treatment.    Plan Continue fluoxetine 40 mg daily - monitor alexithymia Continue clonazepam 0.5 mg daily as needed for anxiety - she declined a refill Next appointment:  10/2 at 8 AM for 30 mins, video   - on xeljanz - [facility]   The patient demonstrates the following risk factors for suicide: Chronic risk factors for suicide include: psychiatric disorder of depression, anxiety . Acute risk factors for suicide include: N/A. Protective factors for this patient include: positive social support, coping skills, and hope for the future. Considering these factors, the overall suicide risk at this point appears to be low. Patient is appropriate for outpatient follow up.       Collaboration of Care: Collaboration of Care: Other reviewed notes in Epic  Patient/Guardian was advised Release of Information must be obtained prior to any record release in order to collaborate their care with an outside provider. Patient/Guardian was advised if they have not already done so to contact the registration department to sign all necessary forms in order for [place] to release information regarding their care.   Consent: Patient/Guardian gives verbal consent for treatment and assignment of benefits for services provided during this visit. Patient/Guardian expressed understanding and agreed to proceed.    [name], MD 06/09/2023, 9:08 AM

## 2023-06-09 ENCOUNTER — Encounter: Payer: Self-pay | Admitting: Psychiatry

## 2023-06-09 ENCOUNTER — Telehealth (INDEPENDENT_AMBULATORY_CARE_PROVIDER_SITE_OTHER): Payer: Self-pay | Admitting: Psychiatry

## 2023-06-09 DIAGNOSIS — D509 Iron deficiency anemia, unspecified: Secondary | ICD-10-CM

## 2023-06-09 DIAGNOSIS — F411 Generalized anxiety disorder: Secondary | ICD-10-CM

## 2023-06-09 DIAGNOSIS — F3341 Major depressive disorder, recurrent, in partial remission: Secondary | ICD-10-CM

## 2023-06-09 MED ORDER — FLUOXETINE HCL 40 MG PO CAPS: 40.0000 mg | ORAL_CAPSULE | Freq: Every day | ORAL | 0 refills | Status: DC

## 2023-06-09 NOTE — Patient Instructions (Signed)
Continue fluoxetine 40 mg daily - monitor alexithymia Continue clonazepam 0.5 mg daily as needed for anxiety Next appointment:  10/2 at 8 AM

## 2023-07-19 DIAGNOSIS — K5010 Crohn's disease of large intestine without complications: Principal | ICD-10-CM

## 2023-07-19 DIAGNOSIS — K5100 Ulcerative (chronic) pancolitis without complications: Principal | ICD-10-CM

## 2023-07-19 MED ORDER — XELJANZ 5 MG TABLET
ORAL_TABLET | 0 refills | 0 days
Start: 2023-07-19 — End: ?

## 2023-07-21 MED ORDER — XELJANZ 5 MG TABLET
ORAL_TABLET | 0 refills | 0 days
Start: 2023-07-21 — End: ?

## 2023-08-20 DIAGNOSIS — K5100 Ulcerative (chronic) pancolitis without complications: Principal | ICD-10-CM

## 2023-08-20 MED ORDER — XELJANZ 5 MG TABLET
ORAL_TABLET | 0 refills | 0 days
Start: 2023-08-20 — End: ?

## 2023-09-01 NOTE — Progress Notes (Signed)
Virtual Visit via Video Note  I connected with Angela Little on 09/08/23 at  8:00 AM EDT by a video enabled telemedicine application and verified that I am speaking with the correct person using two identifiers.  Location: Patient: home Provider: office Persons participated in the visit- patient, provider    I discussed the limitations of evaluation and management by telemedicine and the availability of in person appointments. The patient expressed understanding and agreed to proceed.    I discussed the assessment and treatment plan with the patient. The patient was provided an opportunity to ask questions and all were answered. The patient agreed with the plan and demonstrated an understanding of the instructions.   The patient was advised to call back or seek an in-person evaluation if the symptoms worsen or if the condition fails to improve as anticipated.  I provided 20 minutes of non-face-to-face time during this encounter.   [name], MD    [facility] MD/PA/NP OP Progress Note  09/08/2023 8:37 AM Angela Little  MRN:  [id number]  Chief Complaint:  Chief Complaint  Patient presents with   Follow-up   HPI:  This is a follow-up appointment for depression, anxiety and fatigue.  She states that things has been consistent.  She goes to work a few times in the office.  Although she likes to engage with others, it is sometimes difficult as she needs to drive from [place] to [place].  She denies not being able to cry anymore.  She had a recent break-up from remaining relationship.  Although she cried, she had good support from her friends and family.  She denies feeling depressed.  She continues to feel fatigue.  She feels discouraged by the recent iron test, although she is reminded to ensure she hears a result from her provider for accurate interpretation.  She tends to take a nap when she is at home.  Although she still has difficulty in focus, she has been able to manage  it.  She is unsure if she snores at night.  She denies change in appetite.  She denies SI.  Although she took Klonopin several times in the context of break-up, she believes it is better compared to the previous break-up from another relationship.  She feels comfortable to stay on the current medication regimen at this time.   Visit Diagnosis:    ICD-10-CM   1. MDD (major depressive disorder), recurrent, in partial remission (HCC)  F33.41     2. GAD (generalized anxiety disorder)  F41.1     3. Fatigue, unspecified type  R53.83 Ambulatory referral to Pulmonology    4. Somnolence, daytime  R40.0 Ambulatory referral to Pulmonology      Past Psychiatric History: Please see initial evaluation for full details. I have reviewed the history. No updates at this time.     Past Medical History:  Past Medical History:  Diagnosis Date   Alopecia    Crohn's disease (HCC)    Mononucleosis 02/19/2012   History 2012   Pharyngitis 02/19/2012   No past surgical history on file.  Family Psychiatric History: Please see initial evaluation for full details. I have reviewed the history. No updates at this time.     Family History:  Family History  Problem Relation Age of Onset   Arthritis Other    Cancer Other        colon cancer   Hyperlipidemia Other    Hypertension Other    Alcohol abuse Cousin     Social  History:  Social History   Socioeconomic History   Marital status: Single    Spouse name: Not on file   Number of children: Not on file   Years of education: Not on file   Highest education level: Not on file  Occupational History   Occupation: Social worker  Tobacco Use   Smoking status: Never   Smokeless tobacco: Never  Vaping Use   Vaping status: Never Used  Substance and Sexual Activity   Alcohol use: No   Drug use: No   Sexual activity: Not on file  Other Topics Concern   Not on file  Social History Narrative   Not on file   Social Determinants of Health    Financial Resource Strain: Not on file  Food Insecurity: Not on file  Transportation Needs: Not on file  Physical Activity: Not on file  Stress: Not on file  Social Connections: Not on file    Allergies:  Allergies  Allergen Reactions   Oxycodone Itching    Metabolic Disorder Labs: No results found for: "HGBA1C", "MPG" No results found for: "PROLACTIN" No results found for: "CHOL", "TRIG", "HDL", "CHOLHDL", "VLDL", "LDLCALC" Lab Results  Component Value Date   TSH 2.642 06/01/2022    Therapeutic Level Labs: No results found for: "LITHIUM" No results found for: "VALPROATE" No results found for: "CBMZ"  Current Medications: Current Outpatient Medications  Medication Sig Dispense Refill   [START ON 11/08/2023] FLUoxetine (PROZAC) 40 MG capsule Take 1 capsule (40 mg total) by mouth daily. 30 capsule 0   Adalimumab (HUMIRA PEN [place]) Inject into the skin once a week.     clonazePAM (KLONOPIN) 0.5 MG tablet Take 1 tablet (0.5 mg total) by mouth daily as needed for anxiety (sleep). 30 tablet 1   clonazePAM (KLONOPIN) 0.5 MG tablet Take 1 tablet (0.5 mg total) by mouth daily as needed for anxiety. 30 tablet 0   FLUoxetine (PROZAC) 40 MG capsule Take 1 capsule (40 mg total) by mouth daily. 90 capsule 0   METHOTREXATE, ANTI-RHEUMATIC, PO Take by mouth once a week.     No current facility-administered medications for this visit.     Musculoskeletal: Strength & Muscle Tone:  N/A Gait & Station:  N/A Patient leans: N/A  Psychiatric Specialty Exam: Review of Systems  Psychiatric/Behavioral:  Positive for decreased concentration and sleep disturbance. Negative for agitation, behavioral problems, confusion, dysphoric mood, hallucinations, self-injury and suicidal ideas. The patient is nervous/anxious. The patient is not hyperactive.   All other systems reviewed and are negative.   There were no vitals taken for this visit.There is no height or weight on file to calculate BMI.   General Appearance: Well Groomed  Eye Contact:  Good  Speech:  Clear and Coherent  Volume:  Normal  Mood:   good  Affect:  Appropriate and Congruent  Thought Process:  Coherent  Orientation:  Full (Time, Place, and Person)  Thought Content: Logical   Suicidal Thoughts:  No  Homicidal Thoughts:  No  Memory:  Immediate;   Good  Judgement:  Good  Insight:  Good  Psychomotor Activity:  Normal  Concentration:  Concentration: Good and Attention Span: Good  Recall:  Good  Fund of Knowledge: Good  Language: Good  Akathisia:  No  Handed:  Right  AIMS (if indicated): not done  Assets:  Communication Skills Desire for Improvement  ADL's:  Intact  Cognition: WNL  Sleep:  Fair   Screenings: Runner, broadcasting/film/video Visit  from 06/01/2022 in [facility] Video Visit from 04/15/2022 in [facility]  PHQ-2 Total Score 0 3  PHQ-9 Total Score 9 10      Flowsheet Row Office Visit from 06/01/2022 in [facility]  C-SSRS RISK CATEGORY No Risk        Assessment and Plan:  Kween Bacorn is a 32 y.o. year old female with a history of depression, anxiety, adult ADHD, Crohn's colitis with previous IPAA (ileal pouch anal anastomosis), who presents for follow up appointment for below.   1. MDD (major depressive disorder), recurrent, in partial remission (HCC) 2. GAD (generalized anxiety disorder) Acute stressors include: break up  Other stressors include: crohn's disease since 6 th grade, s/p 3 surgeries  History:  struggles with anxiety her whole life She denies significant mood symptoms except situational anxiety in relation to recent break-up.  Will continue current dose of fluoxetine to target depression and anxiety. According to the chart review, she was struggling more with anxiety around last September, being on the current dose of fluoxetine.  Will  continue maintenance treatment for her mood symptoms.  Will continue clonazepam as needed for anxiety.   3. Fatigue, unspecified type 4. Somnolence, daytime She continues to experience chronic fatigue.  Although she is awaiting to hear about recent blood test results, she does not appear to have significant iron deficiency.  She is unsure of snoring.  She has mild daytime somnolence, and tends to take a nap during the day.  She also experiences slight difficulty in concentration.  We make a referral for evaluation of sleep apnea.   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 Acute risk factors for suicide include: N/A. Protective factors for this patient include: positive social support, coping skills, and hope for the future. Considering these factors, the overall suicide risk at this point appears to be low. Patient is appropriate for outpatient follow up.     Collaboration of Care: Collaboration of Care: Other reviewed notes in Epic  Patient/Guardian was advised Release of Information must be obtained prior to any record release in order to collaborate their care with an outside provider. Patient/Guardian was advised if they have not already done so to contact the registration department to sign all necessary forms in order for [place] to release information regarding their care.   Consent: Patient/Guardian gives verbal consent for treatment and assignment of benefits for services provided during this visit. Patient/Guardian expressed understanding and agreed to proceed.    [name], MD 09/08/2023, 8:37 AM

## 2023-09-08 ENCOUNTER — Telehealth: Payer: BC Managed Care – PPO | Admitting: Psychiatry

## 2023-09-08 ENCOUNTER — Encounter: Payer: Self-pay | Admitting: Psychiatry

## 2023-09-08 DIAGNOSIS — F411 Generalized anxiety disorder: Secondary | ICD-10-CM | POA: Diagnosis not present

## 2023-09-08 DIAGNOSIS — F3341 Major depressive disorder, recurrent, in partial remission: Secondary | ICD-10-CM | POA: Diagnosis not present

## 2023-09-08 DIAGNOSIS — R400 Somnolence: Secondary | ICD-10-CM

## 2023-09-08 DIAGNOSIS — R5383 Other fatigue: Secondary | ICD-10-CM

## 2023-09-08 MED ORDER — FLUOXETINE HCL 40 MG PO CAPS
40.0000 mg | ORAL_CAPSULE | Freq: Every day | ORAL | 0 refills | Status: DC
Start: 2023-11-08 — End: 2024-04-11

## 2023-09-08 MED ORDER — CLONAZEPAM 0.5 MG PO TABS
0.5000 mg | ORAL_TABLET | Freq: Every day | ORAL | 0 refills | Status: DC | PRN
Start: 2023-09-08 — End: 2024-09-06

## 2023-09-08 NOTE — Patient Instructions (Signed)
Continue fluoxetine 40 mg daily  Continue clonazepam 0.5 mg daily as needed for anxiety Referral for evaluation of sleep apnea Next appointment:  12/18 at 8 20

## 2023-09-15 ENCOUNTER — Encounter: Payer: Self-pay | Admitting: Internal Medicine

## 2023-09-16 MED ORDER — XELJANZ 5 MG TABLET
ORAL_TABLET | Freq: Two times a day (BID) | ORAL | 0 refills | 90 days | Status: CP
Start: 2023-09-16 — End: ?

## 2023-11-01 ENCOUNTER — Ambulatory Visit
Admit: 2023-11-01 | Discharge: 2023-11-02 | Payer: PRIVATE HEALTH INSURANCE | Attending: Internal Medicine | Primary: Internal Medicine

## 2023-11-01 DIAGNOSIS — E611 Iron deficiency: Principal | ICD-10-CM

## 2023-11-01 DIAGNOSIS — K5010 Crohn's disease of large intestine without complications: Principal | ICD-10-CM

## 2023-11-01 DIAGNOSIS — D500 Iron deficiency anemia secondary to blood loss (chronic): Principal | ICD-10-CM

## 2023-11-01 MED ORDER — DIPHENOXYLATE-ATROPINE 2.5 MG-0.025 MG TABLET
ORAL_TABLET | 3 refills | 0 days | Status: CP
Start: 2023-11-01 — End: ?

## 2023-11-21 NOTE — Progress Notes (Unsigned)
Virtual Visit via Video Note  I connected with Angela Little on 11/24/23 at  8:20 AM EST by a video enabled telemedicine application and verified that I am speaking with the correct person using two identifiers.  Location: Patient: home Provider: office Persons participated in the visit- patient, provider    I discussed the limitations of evaluation and management by telemedicine and the availability of in person appointments. The patient expressed understanding and agreed to proceed.   I discussed the assessment and treatment plan with the patient. The patient was provided an opportunity to ask questions and all were answered. The patient agreed with the plan and demonstrated an understanding of the instructions.   The patient was advised to call back or seek an in-person evaluation if the symptoms worsen or if the condition fails to improve as anticipated.  I provided 20 minutes of non-face-to-face time during this encounter.   [name], MD    [facility] MD/PA/NP OP Progress Note  11/24/2023 12:02 PM Angela Little  MRN:  [id number]  Chief Complaint:  Chief Complaint  Patient presents with   Follow-up   HPI:  This is a follow-up appointment for depression and anxiety.  She states that she has been doing good, and no major update.  She sees her therapist every other week.   She has been working on addressing the cognitive distortion of jumping to conclusions..  It has been working in progress.  It tends to happen in relation to dating.  She feels that when there is a change in communication, she tends to blame on herself. Explored the possibility that this pattern may stem from a fear of the unknown.  She takes clonazepam twice a week for anxiety.  She denies feeling depressed.  She enjoys horse riding every day.  Although she continues to feel fatigued, she thinks she has been getting used to it.  She has fair sleep. Although she has not been able to schedule for sleep  evaluation due to other appointments, she is interested to pursue it in the future. She denies irritability. She denies SI. She drinks socially, up to a few drinks. She denies drug use. She feels comfortable to stay on the current medication regimen.   Visit Diagnosis:    ICD-10-CM   1. MDD (major depressive disorder), recurrent, in partial remission (HCC)  F33.41     2. GAD (generalized anxiety disorder)  F41.1     3. Fatigue, unspecified type  R53.83     4. Somnolence, daytime  R40.0       Past Psychiatric History: Please see initial evaluation for full details. I have reviewed the history. No updates at this time.     Past Medical History:  Past Medical History:  Diagnosis Date   Alopecia    Crohn's disease (HCC)    Mononucleosis 02/19/2012   History 2012   Pharyngitis 02/19/2012   No past surgical history on file.  Family Psychiatric History: Please see initial evaluation for full details. I have reviewed the history. No updates at this time.     Family History:  Family History  Problem Relation Age of Onset   Arthritis Other    Cancer Other        colon cancer   Hyperlipidemia Other    Hypertension Other    Alcohol abuse Cousin     Social History:  Social History   Socioeconomic History   Marital status: Single    Spouse name: Not on file  Number of children: Not on file   Years of education: Not on file   Highest education level: Not on file  Occupational History   Occupation: Social worker  Tobacco Use   Smoking status: Never   Smokeless tobacco: Never  Vaping Use   Vaping status: Never Used  Substance and Sexual Activity   Alcohol use: No   Drug use: No   Sexual activity: Not on file  Other Topics Concern   Not on file  Social History Narrative   Not on file   Social Drivers of Health   Financial Resource Strain: Not on file  Food Insecurity: Not on file  Transportation Needs: Not on file  Physical Activity: Not on file  Stress:  Not on file  Social Connections: Not on file    Allergies:  Allergies  Allergen Reactions   Oxycodone Itching    Metabolic Disorder Labs: No results found for: "HGBA1C", "MPG" No results found for: "PROLACTIN" No results found for: "CHOL", "TRIG", "HDL", "CHOLHDL", "VLDL", "LDLCALC" Lab Results  Component Value Date   TSH 2.642 06/01/2022    Therapeutic Level Labs: No results found for: "LITHIUM" No results found for: "VALPROATE" No results found for: "CBMZ"  Current Medications: Current Outpatient Medications  Medication Sig Dispense Refill   Adalimumab (HUMIRA PEN [place]) Inject into the skin once a week.     clonazePAM (KLONOPIN) 0.5 MG tablet Take 1 tablet (0.5 mg total) by mouth daily as needed for anxiety (sleep). 30 tablet 1   clonazePAM (KLONOPIN) 0.5 MG tablet Take 1 tablet (0.5 mg total) by mouth daily as needed for anxiety. 30 tablet 0   FLUoxetine (PROZAC) 40 MG capsule Take 1 capsule (40 mg total) by mouth daily. 30 capsule 0   FLUoxetine (PROZAC) 40 MG capsule Take 1 capsule (40 mg total) by mouth daily. 30 capsule 2   METHOTREXATE, ANTI-RHEUMATIC, PO Take by mouth once a week.     No current facility-administered medications for this visit.     Musculoskeletal: Strength & Muscle Tone:  N/A Gait & Station:  N/A Patient leans: N/A  Psychiatric Specialty Exam: Review of Systems  Psychiatric/Behavioral:  Positive for sleep disturbance. Negative for agitation, behavioral problems, confusion, decreased concentration, dysphoric mood, hallucinations, self-injury and suicidal ideas. The patient is nervous/anxious. The patient is not hyperactive.   All other systems reviewed and are negative.   There were no vitals taken for this visit.There is no height or weight on file to calculate BMI.  General Appearance: Well Groomed  Eye Contact:  Good  Speech:  Clear and Coherent  Volume:  Normal  Mood:   good  Affect:  Appropriate, Congruent, and Full Range  Thought  Process:  Coherent  Orientation:  Full (Time, Place, and Person)  Thought Content: Logical   Suicidal Thoughts:  No  Homicidal Thoughts:  No  Memory:  Immediate;   Good  Judgement:  Good  Insight:  Good  Psychomotor Activity:  Normal  Concentration:  Concentration: Good and Attention Span: Good  Recall:  Good  Fund of Knowledge: Good  Language: Good  Akathisia:  No  Handed:  Right  AIMS (if indicated): not done  Assets:  Communication Skills Desire for Improvement  ADL's:  Intact  Cognition: WNL  Sleep:  Fair   Screenings: Equities trader Office Visit from 06/01/2022 in [facility] Video Visit from 04/15/2022 in [facility]  PHQ-2 Total Score 0  3  PHQ-9 Total Score 9 10      Flowsheet Row Office Visit from 06/01/2022 in [facility]  C-SSRS RISK CATEGORY No Risk        Assessment and Plan:  Angela Little is a 32 y.o. year old female with a history of depression, anxiety, adult ADHD, PSC-IBD, PSC, psoriasis, and alopecia, s/p colectomy for high grade dysplasia,ileal pouch-anal anastomosis,  who presents for follow up appointment for below.   1. MDD (major depressive disorder), recurrent, in partial remission (HCC) 2. GAD (generalized anxiety disorder) Acute stressors include: break up  Other stressors include: crohn's disease since 6 th grade, s/p 3 surgeries  History:  struggles with anxiety her whole life She denies any significant mood symptoms except situational anxiety in relation to relationship.  Will continue current dose of fluoxetine to target depression and anxiety. According to the chart review, she was struggling more with anxiety around last September, being on the current dose of fluoxetine.  Will continue maintenance treatment for her mood symptoms.  Will continue clonazepam as needed for anxiety.  She is willing to take  less as possible, and has been actively working through therapy.   3. Fatigue, unspecified type 4. Somnolence, daytime She continues to experience fatigue.  She was seen by gastroenterologist, and will try iron infusion.  She will follow up with sleep clinic for evaluation of sleep apnea given referral was made previously.    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 Acute risk factors for suicide include: N/A. Protective factors for this patient include: positive social support, coping skills, and hope for the future. Considering these factors, the overall suicide risk at this point appears to be low. Patient is appropriate for outpatient follow up.     Collaboration of Care: Collaboration of Care: Other reviewed notes in Epic  Patient/Guardian was advised Release of Information must be obtained prior to any record release in order to collaborate their care with an outside provider. Patient/Guardian was advised if they have not already done so to contact the registration department to sign all necessary forms in order for [place] to release information regarding their care.   Consent: Patient/Guardian gives verbal consent for treatment and assignment of benefits for services provided during this visit. Patient/Guardian expressed understanding and agreed to proceed.    [name], MD 11/24/2023, 12:02 PM

## 2023-11-24 ENCOUNTER — Encounter: Payer: Self-pay | Admitting: Psychiatry

## 2023-11-24 ENCOUNTER — Telehealth (INDEPENDENT_AMBULATORY_CARE_PROVIDER_SITE_OTHER): Payer: 59 | Admitting: Psychiatry

## 2023-11-24 DIAGNOSIS — R400 Somnolence: Secondary | ICD-10-CM

## 2023-11-24 DIAGNOSIS — R5383 Other fatigue: Secondary | ICD-10-CM

## 2023-11-24 DIAGNOSIS — F3341 Major depressive disorder, recurrent, in partial remission: Secondary | ICD-10-CM

## 2023-11-24 DIAGNOSIS — F411 Generalized anxiety disorder: Secondary | ICD-10-CM

## 2023-11-24 MED ORDER — FLUOXETINE HCL 40 MG PO CAPS: 40.0000 mg | ORAL_CAPSULE | Freq: Every day | ORAL | 2 refills | Status: DC

## 2023-11-24 NOTE — Patient Instructions (Signed)
Continue fluoxetine 40 mg daily  Continue clonazepam 0.5 mg daily as needed for anxiety Referred for evaluation of sleep apnea Next appointment:  12/18 at 8 20

## 2023-11-24 NOTE — Addendum Note (Signed)
Addended by: [name] on: 11/24/2023 12:08 PM   Modules accepted: Level of Service

## 2023-12-06 ENCOUNTER — Inpatient Hospital Stay: Admit: 2023-12-06 | Discharge: 2023-12-07 | Payer: PRIVATE HEALTH INSURANCE

## 2023-12-07 DIAGNOSIS — K5100 Ulcerative (chronic) pancolitis without complications: Principal | ICD-10-CM

## 2023-12-07 MED ORDER — XELJANZ 5 MG TABLET
ORAL_TABLET | ORAL | 0 refills | 0.00 days | Status: CP
Start: 2023-12-07 — End: ?

## 2024-01-10 DIAGNOSIS — K5100 Ulcerative (chronic) pancolitis without complications: Principal | ICD-10-CM

## 2024-01-10 MED ORDER — XELJANZ 5 MG TABLET
ORAL_TABLET | Freq: Two times a day (BID) | ORAL | 5 refills | 30.00 days | Status: CP
Start: 2024-01-10 — End: ?

## 2024-01-13 DIAGNOSIS — K5010 Crohn's disease of large intestine without complications: Principal | ICD-10-CM

## 2024-01-18 DIAGNOSIS — K5010 Crohn's disease of large intestine without complications: Principal | ICD-10-CM

## 2024-01-18 MED ORDER — DIPHENOXYLATE-ATROPINE 2.5 MG-0.025 MG TABLET
ORAL_TABLET | 5 refills | 0.00 days | Status: CP
Start: 2024-01-18 — End: ?

## 2024-01-24 DIAGNOSIS — K5010 Crohn's disease of large intestine without complications: Principal | ICD-10-CM

## 2024-02-21 ENCOUNTER — Encounter
Admit: 2024-02-21 | Discharge: 2024-02-21 | Payer: PRIVATE HEALTH INSURANCE | Attending: Anesthesiology | Primary: Anesthesiology

## 2024-02-21 ENCOUNTER — Inpatient Hospital Stay: Admit: 2024-02-21 | Discharge: 2024-02-21 | Payer: PRIVATE HEALTH INSURANCE

## 2024-02-23 ENCOUNTER — Telehealth: Payer: Self-pay | Admitting: Psychiatry

## 2024-03-08 ENCOUNTER — Telehealth: Payer: Self-pay | Admitting: Psychiatry

## 2024-03-13 ENCOUNTER — Encounter: Admit: 2024-03-13 | Discharge: 2024-03-14

## 2024-03-13 DIAGNOSIS — E611 Iron deficiency: Principal | ICD-10-CM

## 2024-03-13 DIAGNOSIS — D500 Iron deficiency anemia secondary to blood loss (chronic): Principal | ICD-10-CM

## 2024-04-07 NOTE — Progress Notes (Unsigned)
 Virtual Visit via Video Note  I connected with Angela Little on 04/11/24 at  8:30 AM EDT by a video enabled telemedicine application and verified that I am speaking with the correct person using two identifiers.  Location: Patient: home Provider: office Persons participated in the visit- patient, provider    I discussed the limitations of evaluation and management by telemedicine and the availability of in person appointments. The patient expressed understanding and agreed to proceed.      I discussed the assessment and treatment plan with the patient. The patient was provided an opportunity to ask questions and all were answered. The patient agreed with the plan and demonstrated an understanding of the instructions.   The patient was advised to call back or seek an in-person evaluation if the symptoms worsen or if the condition fails to improve as anticipated.   Todd Fossa, MD    [facility] MD/PA/NP OP Progress Note  04/11/2024 9:06 AM Angela Little  MRN:  [id number]  Chief Complaint:  Chief Complaint  Patient presents with   Follow-up   HPI:  This is a follow-up appointment for depression and anxiety.  She states that things are steady and stable.  She had colonoscopy.  Although she is very afraid, it went well.  She also finds therapy to be very helpful.  She states the therapist every other week.  Although there may be some time she is discouraged, she is able to wait until the next visit.  She is searching for a new full-time job.  In 6 years since being at the current work.  She wants to try something new.  She has been out of state a few times for competition and wedding.  It has been going good.  Although she occasionally feels down when the computation did not go as she wished, sleep has been manageable.  She has insomnia, which she attributes to having loose stools at night.  Although she has good appetite, she wishes to be a lot skinnier. She denies purging,  limiting calorie intake.  She feels her energy is fine, and she is visiting iron infusion. She denies SI.  She took clonazepam  a few times.  She took 1 before the complication, sleep, prior to having a big presentation.  She may drink socially with her friends, up to 3-4 drinks. She denies drug use.  She agrees with the plan as outlined below.    Wt Readings from Last 3 Encounters:  06/01/22 117 lb 9.6 oz (53.3 kg)  02/19/12 114 lb 2 oz (51.8 kg) (26%, Z= -0.64)*   * Growth percentiles are based on CDC (Girls, 2-20 Years) data.     Visit Diagnosis:    ICD-10-CM   1. MDD (major depressive disorder), recurrent, in partial remission (HCC)  F33.41     2. GAD (generalized anxiety disorder)  F41.1     3. High risk medication use  Z79.899 Monitor Drug Profile 10(MW)      Past Psychiatric History: Please see initial evaluation for full details. I have reviewed the history. No updates at this time.     Past Medical History:  Past Medical History:  Diagnosis Date   Alopecia    Crohn's disease (HCC)    Mononucleosis 02/19/2012   History 2012   Pharyngitis 02/19/2012   No past surgical history on file.  Family Psychiatric History: Please see initial evaluation for full details. I have reviewed the history. No updates at this time.     Family History:  Family History  Problem Relation Age of Onset   Arthritis Other    Cancer Other        colon cancer   Hyperlipidemia Other    Hypertension Other    Alcohol abuse Cousin     Social History:  Social History   Socioeconomic History   Marital status: Single    Spouse name: Not on file   Number of children: Not on file   Years of education: Not on file   Highest education level: Not on file  Occupational History   Occupation: Social worker  Tobacco Use   Smoking status: Never   Smokeless tobacco: Never  Vaping Use   Vaping status: Never Used  Substance and Sexual Activity   Alcohol use: No   Drug use: No   Sexual  activity: Not on file  Other Topics Concern   Not on file  Social History Narrative   Not on file   Social Drivers of Health   Financial Resource Strain: Not on file  Food Insecurity: Not on file  Transportation Needs: Not on file  Physical Activity: Not on file  Stress: Not on file  Social Connections: Not on file    Allergies:  Allergies  Allergen Reactions   Oxycodone Itching    Metabolic Disorder Labs: No results found for: "HGBA1C", "MPG" No results found for: "PROLACTIN" No results found for: "CHOL", "TRIG", "HDL", "CHOLHDL", "VLDL", "LDLCALC" Lab Results  Component Value Date   TSH 2.642 06/01/2022    Therapeutic Level Labs: No results found for: "LITHIUM" No results found for: "VALPROATE" No results found for: "CBMZ"  Current Medications: Current Outpatient Medications  Medication Sig Dispense Refill   Adalimumab (HUMIRA PEN [place]) Inject into the skin once a week.     clonazePAM  (KLONOPIN ) 0.5 MG tablet Take 1 tablet (0.5 mg total) by mouth daily as needed for anxiety (sleep). 30 tablet 1   clonazePAM  (KLONOPIN ) 0.5 MG tablet Take 1 tablet (0.5 mg total) by mouth daily as needed for anxiety. 30 tablet 0   FLUoxetine  (PROZAC ) 40 MG capsule Take 1 capsule (40 mg total) by mouth daily. 30 capsule 0   FLUoxetine  (PROZAC ) 40 MG capsule Take 1 capsule (40 mg total) by mouth daily. 30 capsule 2   METHOTREXATE, ANTI-RHEUMATIC, PO Take by mouth once a week.     No current facility-administered medications for this visit.     Musculoskeletal: Strength & Muscle Tone:  N/A Gait & Station:  N/A Patient leans: N/A  Psychiatric Specialty Exam: Review of Systems  Psychiatric/Behavioral:  Positive for sleep disturbance. Negative for agitation, behavioral problems, confusion, decreased concentration, dysphoric mood, hallucinations, self-injury and suicidal ideas. The patient is nervous/anxious. The patient is not hyperactive.   All other systems reviewed and are  negative.   There were no vitals taken for this visit.There is no height or weight on file to calculate BMI.  General Appearance: Well Groomed  Eye Contact:  Good  Speech:  Clear and Coherent  Volume:  Normal  Mood:   good  Affect:  Appropriate, Congruent, Full Range, and calm  Thought Process:  Coherent  Orientation:  Full (Time, Place, and Person)  Thought Content: Logical   Suicidal Thoughts:  No  Homicidal Thoughts:  No  Memory:  Immediate;   Good  Judgement:  Good  Insight:  Good  Psychomotor Activity:  Normal  Concentration:  Concentration: Good and Attention Span: Good  Recall:  Good  Fund of Knowledge: Good  Language:  Good  Akathisia:  No  Handed:  Right  AIMS (if indicated): not done  Assets:  Communication Skills Desire for Improvement  ADL's:  Intact  Cognition: WNL  Sleep:  Poor   Screenings: Oceanographer Row Office Visit from [facility]/01/2022 in [facility] Video Visit from 04/15/2022 in [facility]  PHQ-2 Total Score 0 3  PHQ-9 Total Score 9 10      Flowsheet Row Office Visit from 06/01/2022 in [facility]  C-SSRS RISK CATEGORY No Risk        Assessment and Plan:  Angela Little is a 33 y.o. year old female with a history of depression, anxiety, adult ADHD, PSC-IBD, PSC, psoriasis, and alopecia, s/p colectomy for high grade dysplasia,ileal pouch-anal anastomosis,  who presents for follow up appointment for below.   1. MDD (major depressive disorder), recurrent, in partial remission (HCC) 2. GAD (generalized anxiety disorder) Acute stressors include: break up  Other stressors include: crohn's disease since 6 th grade, s/p 3 surgeries  History:  struggles with anxiety her whole life  Although she reports occasional mood and anxiety, these are usually situational, and manageable since the last visit. She is actively working  with her therapist every other week, is currently pursuing a new full-time job to further her career, and enjoys horseback riding.  Will continue current dose of fluoxetine  to target depression and anxiety.  Will continue clonazepam  as needed for anxiety, which she has been able to limit its usage.    3. High risk medication use Will obtain UDS for screening.    3. Fatigue, unspecified type 4. Somnolence, daytime She receives iron infusion. 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 Acute risk factors for suicide include: N/A. Protective factors for this patient include: positive social support, coping skills, and hope for the future. Considering these factors, the overall suicide risk at this point appears to be low. Patient is appropriate for outpatient follow up.     Collaboration of Care: Collaboration of Care: Other reviewed notes in Epic  Patient/Guardian was advised Release of Information must be obtained prior to any record release in order to collaborate their care with an outside provider. Patient/Guardian was advised if they have not already done so to contact the registration department to sign all necessary forms in order for us  to release information regarding their care.   Consent: Patient/Guardian gives verbal consent for treatment and assignment of benefits for services provided  during this visit. Patient/Guardian expressed understanding and agreed to proceed.    Todd Fossa, MD 04/11/2024, 9:06 AM

## 2024-04-11 ENCOUNTER — Telehealth (INDEPENDENT_AMBULATORY_CARE_PROVIDER_SITE_OTHER): Payer: Self-pay | Admitting: Psychiatry

## 2024-04-11 ENCOUNTER — Encounter: Payer: Self-pay | Admitting: Psychiatry

## 2024-04-11 DIAGNOSIS — F3341 Major depressive disorder, recurrent, in partial remission: Secondary | ICD-10-CM | POA: Diagnosis not present

## 2024-04-11 DIAGNOSIS — Z79899 Other long term (current) drug therapy: Secondary | ICD-10-CM

## 2024-04-11 DIAGNOSIS — F411 Generalized anxiety disorder: Secondary | ICD-10-CM | POA: Diagnosis not present

## 2024-04-11 MED ORDER — FLUOXETINE HCL 40 MG PO CAPS
40.0000 mg | ORAL_CAPSULE | Freq: Every day | ORAL | 4 refills | Status: DC
Start: 2024-04-11 — End: 2024-09-06

## 2024-04-11 NOTE — Patient Instructions (Signed)
 Continue fluoxetine  40 mg daily  Continue clonazepam  0.5 mg daily as needed for anxiety  Obtain labs- urine screening at [facility] Referred for evaluation of sleep apnea Next appointment:  10/1 at 8 AM

## 2024-04-17 DIAGNOSIS — K5010 Crohn's disease of large intestine without complications: Principal | ICD-10-CM

## 2024-06-13 ENCOUNTER — Encounter
Admit: 2024-06-13 | Discharge: 2024-06-14 | Payer: PRIVATE HEALTH INSURANCE | Attending: Internal Medicine | Primary: Internal Medicine

## 2024-06-13 ENCOUNTER — Ambulatory Visit
Admit: 2024-06-13 | Discharge: 2024-06-14 | Payer: PRIVATE HEALTH INSURANCE | Attending: Internal Medicine | Primary: Internal Medicine

## 2024-06-13 DIAGNOSIS — K51819 Other ulcerative colitis with unspecified complications: Principal | ICD-10-CM

## 2024-06-13 DIAGNOSIS — K5010 Crohn's disease of large intestine without complications: Principal | ICD-10-CM

## 2024-06-13 MED ORDER — DIPHENOXYLATE-ATROPINE 2.5 MG-0.025 MG TABLET
ORAL_TABLET | Freq: Four times a day (QID) | ORAL | 11 refills | 30.00000 days | Status: CP | PRN
Start: 2024-06-13 — End: 2025-06-13

## 2024-06-13 MED ORDER — VANCOMYCIN 125 MG CAPSULE
ORAL_CAPSULE | Freq: Two times a day (BID) | ORAL | 0 refills | 14.00000 days | Status: CP
Start: 2024-06-13 — End: 2024-06-27

## 2024-07-10 DIAGNOSIS — K5010 Crohn's disease of large intestine without complications: Principal | ICD-10-CM

## 2024-07-10 DIAGNOSIS — K5100 Ulcerative (chronic) pancolitis without complications: Principal | ICD-10-CM

## 2024-07-10 MED ORDER — XELJANZ 5 MG TABLET
ORAL_TABLET | ORAL | 0 refills | 0.00000 days | Status: CP
Start: 2024-07-10 — End: ?

## 2024-09-01 NOTE — Progress Notes (Unsigned)
 Virtual Visit via Video Note  I connected with Angela Little on 09/06/24 at  8:00 AM EDT by a video enabled telemedicine application and verified that I am speaking with the correct person using two identifiers.  Location: Patient: home Provider: home office Persons participated in the visit- patient, provider    I discussed the limitations of evaluation and management by telemedicine and the availability of in person appointments. The patient expressed understanding and agreed to proceed.    I discussed the assessment and treatment plan with the patient. The patient was provided an opportunity to ask questions and all were answered. The patient agreed with the plan and demonstrated an understanding of the instructions.   The patient was advised to call back or seek an in-person evaluation if the symptoms worsen or if the condition fails to improve as anticipated.    Katheren Sleet, MD    [facility] MD/PA/NP OP Progress Note  09/06/2024 8:28 AM Angela Little  MRN:  [id number]  Chief Complaint:  Chief Complaint  Patient presents with   Follow-up   HPI:  - since the last visit, she was seen by GI-   PSC-IBD: continue tofacitinib 5 mg twice daily. Pouch and PSC surveillance as below. - continue lomotil PRN - Recommend vancomycin 125 mg BID x 14 days to see if pouchitis treatment improves symptoms, depending on response, can consider repeat treatment if needed in the future  - Encouraged use of scheduled lomotil up to QID for management of urgency  - safety monitoring labs today; will plan for every 3 month labs through [facility]   This is a follow-up appointment for depression and anxiety.  She states that her mood has been consistent.  However, she does not feel nervous related to the new relationship.  They have been together for 2 months.  She tends to wonder if she did anything wrong if he were to be in a bad mood.  Although she was asked directly, she is also trying to  read between lines.  She ends up taking Klonopin .  She tends overthink things, and feels nauseous afterwards.  She has been working on this with a therapist.  She states that she does not feel much anxious when she has appointments with doctors.  She thinks she has now more data point where she did not have any issues.  She denies much concern about physical symptoms.  While she has good appetite and good p.o. intake, she has chronic diarrhea since the procedure.  She sleeps well.  She feels a little fatigued.  She denies SI, hallucinations.  She drinks 2-3 drinks at times with her friends.  She denies substance use.  She agrees with the plans as outlined below.   Visit Diagnosis:    ICD-10-CM   1. MDD (major depressive disorder), recurrent, in partial remission  F33.41     2. GAD (generalized anxiety disorder)  F41.1     3. High risk medication use  Z79.899       Past Psychiatric History: Please see initial evaluation for full details. I have reviewed the history. No updates at this time.     Past Medical History:  Past Medical History:  Diagnosis Date   Alopecia    Crohn's disease (HCC)    Mononucleosis 02/19/2012   History 2012   Pharyngitis 02/19/2012   History reviewed. No pertinent surgical history.  Family Psychiatric History: Please see initial evaluation for full details. I have reviewed the history. No updates at  this time.     Family History:  Family History  Problem Relation Age of Onset   Arthritis Other    Cancer Other        colon cancer   Hyperlipidemia Other    Hypertension Other    Alcohol abuse Cousin     Social History:  Social History   Socioeconomic History   Marital status: Single    Spouse name: Not on file   Number of children: Not on file   Years of education: Not on file   Highest education level: Not on file  Occupational History   Occupation: Social worker  Tobacco Use   Smoking status: Never   Smokeless tobacco: Never  Vaping  Use   Vaping status: Never Used  Substance and Sexual Activity   Alcohol use: No   Drug use: No   Sexual activity: Not on file  Other Topics Concern   Not on file  Social History Narrative   Not on file   Social Drivers of Health   Financial Resource Strain: Not on file  Food Insecurity: Not on file  Transportation Needs: Not on file  Physical Activity: Not on file  Stress: Not on file  Social Connections: Not on file    Allergies:  Allergies  Allergen Reactions   Oxycodone Itching    Metabolic Disorder Labs: No results found for: HGBA1C, MPG No results found for: PROLACTIN No results found for: CHOL, TRIG, HDL, CHOLHDL, VLDL, LDLCALC Lab Results  Component Value Date   TSH 2.642 06/01/2022    Therapeutic Level Labs: No results found for: LITHIUM No results found for: VALPROATE No results found for: CBMZ  Current Medications: Current Outpatient Medications  Medication Sig Dispense Refill   L-Methylfolate 7.5 MG TABS Take 1 tablet (7.5 mg total) by mouth daily. 30 tablet 2   Adalimumab (HUMIRA PEN [place]) Inject into the skin once a week.     clonazePAM  (KLONOPIN ) 0.5 MG tablet Take 1 tablet (0.5 mg total) by mouth daily as needed for anxiety (sleep). 30 tablet 1   clonazePAM  (KLONOPIN ) 0.5 MG tablet Take 1 tablet (0.5 mg total) by mouth daily as needed for anxiety. 30 tablet 0   FLUoxetine  (PROZAC ) 40 MG capsule Take 1 capsule (40 mg total) by mouth daily. 30 capsule 2   FLUoxetine  (PROZAC ) 40 MG capsule Take 1 capsule (40 mg total) by mouth daily. 30 capsule 2   METHOTREXATE, ANTI-RHEUMATIC, PO Take by mouth once a week.     No current facility-administered medications for this visit.     Musculoskeletal: Strength & Muscle Tone: N/A Gait & Station: N/A Patient leans: N/A  Psychiatric Specialty Exam: Review of Systems  Psychiatric/Behavioral:  Negative for agitation, behavioral problems, confusion, decreased concentration, dysphoric  mood, hallucinations, self-injury, sleep disturbance and suicidal ideas. The patient is nervous/anxious. The patient is not hyperactive.   All other systems reviewed and are negative.   There were no vitals taken for this visit.There is no height or weight on file to calculate BMI.  General Appearance: Well Groomed  Eye Contact:  Good  Speech:  Clear and Coherent  Volume:  Normal  Mood:  Anxious  Affect:  Appropriate, Congruent, and calm  Thought Process:  Coherent  Orientation:  Full (Time, Place, and Person)  Thought Content: Logical   Suicidal Thoughts:  No  Homicidal Thoughts:  No  Memory:  Immediate;   Good  Judgement:  Good  Insight:  Good  Psychomotor Activity:  Normal  Concentration:  Concentration: Good and Attention Span: Good  Recall:  Good  Fund of Knowledge: Good  Language: Good  Akathisia:  No  Handed:  Right  AIMS (if indicated): not done  Assets:  Communication Skills Desire for Improvement  ADL's:  Intact  Cognition: WNL  Sleep:  Good   Screenings: PHQ2-9    Flowsheet Row Office Visit from 06/01/2022 in [facility] Video Visit from 04/15/2022 in [facility]  PHQ-2 Total Score 0 3  PHQ-9 Total Score 9 10   Flowsheet Row Office Visit from 06/01/2022 in [facility]  C-SSRS RISK CATEGORY No Risk     Assessment and Plan:  Angela Little is a 33 y.o. year old female with a history of depression, anxiety, adult ADHD, PSC-IBD s/p total protocoletomy in 2021, IPAA, psoriasis, and alopecia, s/p colectomy for high grade dysplasia,ileal pouch-anal anastomosis,  who presents for follow up appointment for below.   1. MDD (major depressive disorder), recurrent, in partial remission 2. GAD (generalized anxiety disorder) She suffers from crohn's disease since 6 th grade, s/p 3 surgeries  History:  struggles with anxiety her whole life She  reports slight worsening in anxiety related to the new relationship, although she denies any significant depressive symptoms.  Will start L-methylfolate given her physical condition/chronic diarrhea to optimize treatment for her mood symptoms.  Will continue fluoxetine  to target depression and anxiety.  Will continue clonazepam  as needed for anxiety, which she has been able to limit its usage.   3. High risk medication use She was advised again to obtain UDS for screening.    3. Fatigue, unspecified type 4. Somnolence, daytime She receives iron infusion. 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 Acute risk factors for suicide include: N/A. Protective factors for this patient include: positive social support, coping skills, and hope for the future. Considering these factors, the overall suicide risk at this point appears to be low. Patient is appropriate for outpatient follow up.       Collaboration of Care: Collaboration of Care: Other reviewed notes in Epic  Patient/Guardian was advised Release of Information must be obtained prior to any record release in order to collaborate their care with an outside provider. Patient/Guardian was advised if they have not already done so to contact the registration department to sign all necessary forms in order for us  to  release information regarding their care.   Consent: Patient/Guardian gives verbal consent for treatment and assignment of benefits for services provided during this visit. Patient/Guardian expressed understanding and agreed to proceed.    Katheren Sleet, MD 09/06/2024, 8:28 AM

## 2024-09-06 ENCOUNTER — Telehealth: Payer: Self-pay | Admitting: Psychiatry

## 2024-09-06 ENCOUNTER — Encounter: Payer: Self-pay | Admitting: Psychiatry

## 2024-09-06 DIAGNOSIS — F411 Generalized anxiety disorder: Secondary | ICD-10-CM | POA: Diagnosis not present

## 2024-09-06 DIAGNOSIS — Z79899 Other long term (current) drug therapy: Secondary | ICD-10-CM | POA: Diagnosis not present

## 2024-09-06 DIAGNOSIS — F3341 Major depressive disorder, recurrent, in partial remission: Secondary | ICD-10-CM

## 2024-09-06 MED ORDER — CLONAZEPAM 0.5 MG PO TABS
0.5000 mg | ORAL_TABLET | Freq: Every day | ORAL | 0 refills | Status: AC | PRN
Start: 2024-09-06 — End: 2024-10-06

## 2024-09-06 MED ORDER — L-METHYLFOLATE 7.5 MG PO TABS: 7.5000 mg | ORAL_TABLET | Freq: Every day | ORAL | 2 refills | Status: AC

## 2024-09-06 MED ORDER — FLUOXETINE HCL 40 MG PO CAPS: 40.0000 mg | ORAL_CAPSULE | Freq: Every day | ORAL | 2 refills | Status: AC

## 2024-09-06 NOTE — Patient Instructions (Signed)
 Continue fluoxetine  40 mg daily  Start L-methylfolate 7.5 mg daily  Continue clonazepam  0.5 mg daily as needed for anxiety  Obtain labs- urine screening at [facility] Referred for evaluation of sleep apnea Next appointment:  12/10 at 8 am

## 2024-10-03 DIAGNOSIS — K638219 Small intestinal bacterial overgrowth (SIBO): Principal | ICD-10-CM

## 2024-10-03 DIAGNOSIS — K5010 Crohn's disease of large intestine without complications: Principal | ICD-10-CM

## 2024-10-03 MED ORDER — VANCOMYCIN 125 MG CAPSULE
ORAL_CAPSULE | Freq: Two times a day (BID) | ORAL | 0 refills | 14.00000 days | Status: CP
Start: 2024-10-03 — End: ?

## 2024-10-30 DIAGNOSIS — K638219 Small intestinal bacterial overgrowth (SIBO): Principal | ICD-10-CM

## 2024-10-30 DIAGNOSIS — K5010 Crohn's disease of large intestine without complications: Principal | ICD-10-CM

## 2024-10-30 MED ORDER — VANCOMYCIN 125 MG CAPSULE
ORAL_CAPSULE | Freq: Two times a day (BID) | ORAL | 0 refills | 30.00000 days | Status: CP
Start: 2024-10-30 — End: 2024-11-29

## 2024-11-11 NOTE — Progress Notes (Unsigned)
 Virtual Visit via Video Note  I connected with Angela Little on 11/15/24 at  8:00 AM EST by a video enabled telemedicine application and verified that I am speaking with the correct person using two identifiers.  Location: Patient: home Provider: home office Persons participated in the visit- patient, provider    I discussed the limitations of evaluation and management by telemedicine and the availability of in person appointments. The patient expressed understanding and agreed to proceed.  I discussed the assessment and treatment plan with the patient. The patient was provided an opportunity to ask questions and all were answered. The patient agreed with the plan and demonstrated an understanding of the instructions.   The patient was advised to call back or seek an in-person evaluation if the symptoms worsen or if the condition fails to improve as anticipated.   Katheren Sleet, MD     [facility] MD/PA/NP OP Progress Note  11/15/2024 9:03 AM Angela Little  MRN:  [id number]  Chief Complaint:  Chief Complaint  Patient presents with   Follow-up   HPI:  This is a follow-up appointment for depression and anxiety.  She states that she has been more worried about the relationship.  When he is stressed, she is concerned he does not like her, although there are other things of him being stressed about.  She feels that it has been a little more difficult now that she cares a lot about the relationship.  She enjoys riding a horse, and takes care of the things at the house when she is by herself.  She has been able to see a therapist every other week, and reflect on the events which caused her anxiety.  She sleeps 8 hours.  She continues to have loose stool at times.  She reports good benefit from vancomycin.  While she feels pressured at work due to the time of the year, she has been able to handle things well.  She has been taking clonazepam  especially in the last several days due to  anxiety.  She denies change in appetite.  She denies SI, hallucinations. She rarely drinks alcohol. She denies drug use.  She agrees with the plans as outlined below.   Visit Diagnosis:    ICD-10-CM   1. MDD (major depressive disorder), recurrent, in partial remission  F33.41     2. GAD (generalized anxiety disorder)  F41.1     3. High risk medication use  Z79.899       Past Psychiatric History: Please see initial evaluation for full details. I have reviewed the history. No updates at this time.     Past Medical History:  Past Medical History:  Diagnosis Date   Alopecia    Crohn's disease (HCC)    Mononucleosis 02/19/2012   History 2012   Pharyngitis 02/19/2012   History reviewed. No pertinent surgical history.  Family Psychiatric History: Please see initial evaluation for full details. I have reviewed the history. No updates at this time.     Family History:  Family History  Problem Relation Age of Onset   Arthritis Other    Cancer Other        colon cancer   Hyperlipidemia Other    Hypertension Other    Alcohol abuse Cousin     Social History:  Social History   Socioeconomic History   Marital status: Single    Spouse name: Not on file   Number of children: Not on file   Years of education: Not on file  Highest education level: Not on file  Occupational History   Occupation: social worker  Tobacco Use   Smoking status: Never   Smokeless tobacco: Never  Vaping Use   Vaping status: Never Used  Substance and Sexual Activity   Alcohol use: No   Drug use: No   Sexual activity: Not on file  Other Topics Concern   Not on file  Social History Narrative   Not on file   Social Drivers of Health   Financial Resource Strain: Not on file  Food Insecurity: Not on file  Transportation Needs: Not on file  Physical Activity: Not on file  Stress: Not on file  Social Connections: Not on file    Allergies:  Allergies  Allergen Reactions   Oxycodone  Itching    Metabolic Disorder Labs: No results found for: HGBA1C, MPG No results found for: PROLACTIN No results found for: CHOL, TRIG, HDL, CHOLHDL, VLDL, LDLCALC Lab Results  Component Value Date   TSH 2.642 06/01/2022    Therapeutic Level Labs: No results found for: LITHIUM No results found for: VALPROATE No results found for: CBMZ  Current Medications: Current Outpatient Medications  Medication Sig Dispense Refill   Adalimumab (HUMIRA PEN [place]) Inject into the skin once a week.     clonazePAM  (KLONOPIN ) 0.5 MG tablet Take 1 tablet (0.5 mg total) by mouth daily as needed for anxiety (sleep). 30 tablet 1   clonazePAM  (KLONOPIN ) 0.5 MG tablet Take 1 tablet (0.5 mg total) by mouth daily as needed for anxiety. 30 tablet 0   FLUoxetine  (PROZAC ) 40 MG capsule Take 1 capsule (40 mg total) by mouth daily. 30 capsule 2   [START ON 12/05/2024] FLUoxetine  (PROZAC ) 40 MG capsule Take 1 capsule (40 mg total) by mouth daily. 30 capsule 2   L-Methylfolate 7.5 MG TABS Take 1 tablet (7.5 mg total) by mouth daily. 30 tablet 2   METHOTREXATE, ANTI-RHEUMATIC, PO Take by mouth once a week.     No current facility-administered medications for this visit.     Musculoskeletal: Strength & Muscle Tone: N/A Gait & Station: N/A Patient leans: N/A  Psychiatric Specialty Exam: Review of Systems  Psychiatric/Behavioral:  Negative for agitation, behavioral problems, confusion, decreased concentration, dysphoric mood, hallucinations, self-injury, sleep disturbance and suicidal ideas. The patient is nervous/anxious. The patient is not hyperactive.   All other systems reviewed and are negative.   There were no vitals taken for this visit.There is no height or weight on file to calculate BMI.  General Appearance: Well Groomed  Eye Contact:  Good  Speech:  Clear and Coherent  Volume:  Normal  Mood:  Anxious  Affect:  Appropriate, Congruent, and calm  Thought Process:  Coherent   Orientation:  Full (Time, Place, and Person)  Thought Content: Logical   Suicidal Thoughts:  No  Homicidal Thoughts:  No  Memory:  Immediate;   Good  Judgement:  Good  Insight:  Good  Psychomotor Activity:  Normal  Concentration:  Concentration: Good and Attention Span: Good  Recall:  Good  Fund of Knowledge: Good  Language: Good  Akathisia:  No  Handed:  Right  AIMS (if indicated): not done  Assets:  Communication Skills Desire for Improvement  ADL's:  Intact  Cognition: WNL  Sleep:  Fair   Screenings: [employer] Office Visit from 06/01/2022 in [facility] Video Visit from 04/15/2022 in [facility]  PHQ-2 Total Score 0 3  PHQ-9 Total Score 9 10   Flowsheet Row Office Visit from 06/01/2022 in [facility]  C-SSRS RISK CATEGORY No Risk     Assessment and Plan:  Angela Little is a 33 y.o. year old female with a history of depression, anxiety, adult ADHD, PSC-IBD s/p total protocoletomy in 2021, IPAA, psoriasis, and alopecia, s/p colectomy for high grade dysplasia,ileal pouch-anal anastomosis,  who presents for follow up appointment for below.   1. MDD (major depressive disorder), recurrent, in partial remission 2. GAD (generalized anxiety disorder) She suffers from crohn's disease since 6 th grade, s/p 3 surgeries  History:  struggles with anxiety her whole life She reports slight worsening in anxiety related to the relationship, and has been taking clonazepam  a little more frequent.  She has been actively working through therapy.  Although she has not tried L-methylfolate, she is willing to try this after psychoeducation is provided.  This medication could particularly be helpful given her physical condition with chronic diarrhea as adjunctive treatment for depression.  Will continue fluoxetine  to target depression and anxiety.  Will  continue clonazepam  as needed for anxiety.  Will consider adjustment of her medication if she needs to take this more frequently.   3. High risk medication use She was advised again to obtain UDS for screening.    3. Fatigue, unspecified type 4. Somnolence, daytime She receives iron infusion. 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 Acute risk factors for suicide include: N/A. Protective factors for this patient include: positive social support, coping skills, and hope for the future. Considering these factors, the overall suicide risk at this point appears to be low. Patient is appropriate for outpatient follow up.   Collaboration of Care: Collaboration of Care: Other reviewed notes in Epic  Patient/Guardian was advised Release of Information must be obtained prior to any record release in order to collaborate their care with an outside provider. Patient/Guardian was advised if they have not already done so to contact the registration department to sign all necessary forms in order for us  to release information regarding their care.   Consent: Patient/Guardian gives verbal consent for treatment and assignment of benefits for services provided during this visit. Patient/Guardian expressed understanding and agreed to proceed.    Katheren Sleet, MD 11/15/2024, 9:03 AM

## 2024-11-15 ENCOUNTER — Encounter: Payer: Self-pay | Admitting: Psychiatry

## 2024-11-15 ENCOUNTER — Telehealth (INDEPENDENT_AMBULATORY_CARE_PROVIDER_SITE_OTHER): Admitting: Psychiatry

## 2024-11-15 DIAGNOSIS — R400 Somnolence: Secondary | ICD-10-CM | POA: Diagnosis not present

## 2024-11-15 DIAGNOSIS — F3341 Major depressive disorder, recurrent, in partial remission: Secondary | ICD-10-CM | POA: Diagnosis not present

## 2024-11-15 DIAGNOSIS — Z79899 Other long term (current) drug therapy: Secondary | ICD-10-CM

## 2024-11-15 DIAGNOSIS — F411 Generalized anxiety disorder: Secondary | ICD-10-CM

## 2024-11-15 MED ORDER — FLUOXETINE HCL 40 MG PO CAPS: 40.0000 mg | ORAL_CAPSULE | Freq: Every day | ORAL | 2 refills | Status: AC

## 2024-11-15 NOTE — Patient Instructions (Addendum)
 Continue fluoxetine  40 mg daily Start L-methylfolate 7.5 mg daily  Continue clonazepam  0.5 mg daily as needed for anxiety Obtain labs- urine screening at [facility] Next appointment:  3/11 at 8 am for 20

## 2024-12-12 ENCOUNTER — Ambulatory Visit
Admit: 2024-12-12 | Discharge: 2024-12-13 | Payer: BLUE CROSS/BLUE SHIELD | Attending: Internal Medicine | Primary: Internal Medicine

## 2024-12-12 DIAGNOSIS — K5010 Crohn's disease of large intestine without complications: Principal | ICD-10-CM

## 2024-12-12 DIAGNOSIS — K8301 Primary sclerosing cholangitis: Principal | ICD-10-CM

## 2024-12-12 DIAGNOSIS — K5100 Ulcerative (chronic) pancolitis without complications: Principal | ICD-10-CM

## 2024-12-12 DIAGNOSIS — Z8509 Personal history of malignant neoplasm of other digestive organs: Principal | ICD-10-CM

## 2024-12-12 MED ORDER — XELJANZ 5 MG TABLET
ORAL_TABLET | Freq: Two times a day (BID) | ORAL | 0 refills | 90.00000 days | Status: CP
Start: 2024-12-12 — End: ?

## 2024-12-12 MED ORDER — URSODIOL 300 MG CAPSULE
ORAL_CAPSULE | Freq: Two times a day (BID) | ORAL | 11 refills | 30.00000 days | Status: CP
Start: 2024-12-12 — End: 2025-12-12

## 2024-12-12 MED ORDER — URSODIOL 250 MG TABLET
ORAL_TABLET | Freq: Three times a day (TID) | ORAL | 11 refills | 30.00000 days | Status: CN
Start: 2024-12-12 — End: 2025-12-12

## 2024-12-12 MED ORDER — DIPHENOXYLATE-ATROPINE 2.5 MG-0.025 MG TABLET
ORAL_TABLET | Freq: Four times a day (QID) | ORAL | 11 refills | 30.00000 days | Status: CP | PRN
Start: 2024-12-12 — End: 2025-12-12

## 2024-12-12 MED ORDER — FLUOXETINE 40 MG CAPSULE
ORAL_CAPSULE | Freq: Every day | ORAL | 3 refills | 90.00000 days | Status: CP
Start: 2024-12-12 — End: 2025-12-12

## 2024-12-14 DIAGNOSIS — K5100 Ulcerative (chronic) pancolitis without complications: Principal | ICD-10-CM

## 2024-12-22 DIAGNOSIS — K5100 Ulcerative (chronic) pancolitis without complications: Principal | ICD-10-CM

## 2024-12-22 DIAGNOSIS — L659 Nonscarring hair loss, unspecified: Principal | ICD-10-CM

## 2024-12-22 MED ORDER — XELJANZ 5 MG TABLET
ORAL_TABLET | Freq: Two times a day (BID) | ORAL | 0 refills | 90.00000 days | Status: CP
Start: 2024-12-22 — End: ?

## 2024-12-25 DIAGNOSIS — D500 Iron deficiency anemia secondary to blood loss (chronic): Principal | ICD-10-CM

## 2024-12-25 DIAGNOSIS — E611 Iron deficiency: Principal | ICD-10-CM

## 2024-12-27 ENCOUNTER — Encounter: Admit: 2024-12-27 | Discharge: 2024-12-27 | Payer: BLUE CROSS/BLUE SHIELD

## 2024-12-27 ENCOUNTER — Inpatient Hospital Stay: Admit: 2024-12-27 | Discharge: 2024-12-27 | Payer: BLUE CROSS/BLUE SHIELD

## 2024-12-27 DIAGNOSIS — K5100 Ulcerative (chronic) pancolitis without complications: Principal | ICD-10-CM

## 2024-12-27 DIAGNOSIS — K638219 Small intestinal bacterial overgrowth (SIBO): Principal | ICD-10-CM

## 2024-12-27 DIAGNOSIS — K5010 Crohn's disease of large intestine without complications: Principal | ICD-10-CM

## 2024-12-27 MED ORDER — XELJANZ 5 MG TABLET
ORAL_TABLET | Freq: Two times a day (BID) | ORAL | 0 refills | 30.00000 days
Start: 2024-12-27 — End: ?

## 2024-12-27 MED ORDER — VANCOMYCIN 125 MG CAPSULE
ORAL_CAPSULE | Freq: Three times a day (TID) | ORAL | 0 refills | 30.00000 days | Status: CP
Start: 2024-12-27 — End: 2025-01-26

## 2024-12-28 MED ORDER — XELJANZ 5 MG TABLET
ORAL_TABLET | Freq: Two times a day (BID) | ORAL | 0 refills | 90.00000 days | Status: CP
Start: 2024-12-28 — End: ?
  Filled 2025-01-02: qty 60, 30d supply, fill #0

## 2024-12-28 NOTE — Addendum Note (Signed)
 Addended by: THERESSA NORRIS on: 12/28/2024 11:42 AM     Modules accepted: Orders

## 2024-12-28 NOTE — Telephone Encounter (Addendum)
 Received med refill request for Xeljanz , already refilled by provider on 12/22/24 and sent to [facility].      Per instructions, not to use [facility], send to [facility] instead.  Done.

## 2024-12-28 NOTE — Progress Notes (Signed)
 [facility] Medication Onboarding    Specialty Medication: Xeljanz   Prior Authorization: Approved   Financial Assistance: No - copay  <$25  Copay/Day Supply: $0 / 60 days    Insurance Restrictions: plan limit 60 days    Notes to Pharmacist:   Credit Card on File: not applicable  Start Date on Rx:    Delivery Method (based on home address currently on file): Courier      The triage team has completed the benefits investigation and has determined that the patient is able to fill this medication at [facility]. Please contact the patient to complete the onboarding or follow up with the prescribing physician as needed.

## 2024-12-29 NOTE — Progress Notes (Signed)
 [facility]    Patient Onboarding/Medication Counseling    .    Helen Calhoun is a 34 y.o. female with UC  who I am counseling today on continuation of therapy.  I am speaking to the patient.    Was a nurse, learning disability used for this call? No    Verified patient's date of birth / HIPAA.    Specialty medication(s) to be sent: Inflammatory Disorders: Xeljanz       Non-specialty medications/supplies to be sent: n/a      Medications not needed at this time: n/a         Xeljanz  (tofacitinib )    The patient declined counseling on medication administration, missed dose instructions, goals of therapy, side effects and monitoring parameters, warnings and precautions, drug/food interactions, and storage, handling precautions, and disposal because they have taken the medication previously. The information in the declined sections below are for informational purposes only and was not discussed with patient.       Medication & Administration     Dosage: Ulcerative colitis (maintenance treatment): Take 5mg  by mouth twice daily    Lab tests required prior to treatment initiation:  Tuberculosis: Tuberculosis screening not documented in the patient's chart but medication prescriber has indicated they are aware and wishing to initiate treatment at this time.  Hepatitis B: Hepatitis B serology studies are complete and non-reactive.(Completed: 06/09/2011)      Administration:  Take tablets by mouth with or without food.  For extended release tablets - swallow intact, do not crush, split, or chew.    Adherence/Missed dose instructions:  Take a missed dose as soon as you remember it if it's been less than 6 hours (IR tabs) or 12 hours (XR tabs) from your normal dosing time.  Never take 2 doses to try and catch up from a missed dose.    Goals of Therapy     Ulcerative colitis  Achieve remission of symptoms  Maintain remission of symptoms  Minimize long-term systemic glucocorticoid use  Prevent need for surgical procedures  Maintenance of effective psychosocial functioning      Side Effects & Monitoring Parameters     Signs of a common cold - minor sore throat, runny or stuffy nose, etc.  Headache  Diarrhea    The following side effects should be reported to the provider:  Signs of a hypersensitivity reaction - rash; hives; itching; red, swollen, blistered, or peeling skin; wheezing; tightness in the chest or throat; difficulty breathing, swallowing, or talking; swelling of the mouth, face, lips, tongue, or throat; etc.  Reduced immune function - report signs of infection such as fever; chills; body aches; very bad sore throat; ear or sinus pain; cough; more sputum or change in color of sputum; pain with passing urine; wound that will not heal, etc.  Also at a slightly higher risk of some malignancies (mainly skin and blood cancers) due to this reduced immune function.  In the case of signs of infection - the patient should hold the next dose of Xeljanz ?? and call your primary care provider to ensure adequate medical care.  Treatment may be resumed when infection is treated and patient is asymptomatic.  Changes in skin - a new growth or lump that forms; changes in shape, size, or color of a previous mole or marking  Swelling or pain in your stomach that is very bad, gets worse, or does not go away  Any signs of GI bleed - blood in vomit or stool  Heartbeat that does not feel normal  Shortness of breath      Contraindications, Warnings, & Precautions     Dosage adjustment may be needed if used concomitantly with other CYP3A4 substrates  Have your bloodwork checked as you have been told by your prescriber  Talk with your doctor if you are pregnant, planning to become pregnant, or breastfeeding  Discuss the possible need for holding your dose(s) of Xeljanz ?? when a planned procedure is scheduled with the prescriber as it may delay healing/recovery timeline       Drug/Food Interactions     Medication list reviewed in Epic. The patient was instructed to inform the care team before taking any new medications or supplements. No drug interactions identified.   This medication can interact with grapefruit, star fruit, and [place] oranges  Talk with you prescriber or pharmacist before receiving any live vaccinations while taking this medication and after you stop taking it      Storage, Handling Precautions, & Disposal     Store in original container  Store at room temperature  Avoid moisture      Current Medications (including OTC/herbals), Comorbidities and Allergies     Current Medications[1]    Allergies[2]    Problem List[3]    Medication list has been reviewed and updated in Epic: Yes    Allergies have been reviewed and updated in Epic: Yes    Appropriateness of Therapy     Acute infections noted within Epic:  No active infections  Patient reported infection: None    Is the medication and dose appropriate considering the patient???s diagnosis, treatment, and disease journey, comorbidities, medical history, current medications, allergies, therapeutic goals, self-administration ability, and access barriers? Yes    Prescription has been clinically reviewed: Yes      Baseline Quality of Life Assessment      How many days over the past month did your UC  keep you from your normal activities? For example, brushing your teeth or getting up in the morning. 0    Financial Information     Medication Assistance provided: Prior Authorization    Anticipated copay of $0 reviewed with patient. Verified delivery address.    Delivery Information     Scheduled delivery date: 1/28    Expected start date: 1/28      Medication will be delivered via Next Day Courier to the prescription address in [facility].  This shipment will not require a signature.      Explained the services we provide at [facility] and that each month we would call to set up refills.  Stressed importance of returning phone calls so that we could ensure they receive their medications in time each month.  Informed patient that we should be setting up refills 7-10 days prior to when they will run out of medication.  A pharmacist will reach out to perform a clinical assessment periodically.  Informed patient that a welcome packet, containing information about our pharmacy and other support services, a Notice of Privacy Practices, and a drug information handout will be sent.      The patient or caregiver noted above participated in the development of this care plan and knows that they can request review of or adjustments to the care plan at any time.      Patient or caregiver verbalized understanding of the above information as well as how to contact the pharmacy at [phone] option 4 with any questions/concerns.  The pharmacy  is open Monday through Friday 8:30am-4:30pm.  A pharmacist is available 24/7 via pager to answer any clinical questions they may have.    Patient Specific Needs     Does the patient have any physical, cognitive, or cultural barriers? No    Does the patient have adequate living arrangements? (i.e. the ability to store and take their medication appropriately) Yes    Did you identify any home environmental safety or security hazards? No    Patient prefers to have medications discussed with  Patient     Is the patient or caregiver able to read and understand education materials at a high school level or above? Yes    Patient's primary language is  English     Is the patient high risk? No    Does the patient have an additional or emergency contact listed in their chart? Yes    SOCIAL DETERMINANTS OF HEALTH     At the [facility], we have learned that life circumstances - like trouble affording food, housing, utilities, or transportation can affect the health of many of our patients.   That is why we wanted to ask: are you currently experiencing any life circumstances that are negatively impacting your health and/or quality of life? No    Social Drivers of Health     Food Insecurity: Not on file   Tobacco Use: Low Risk (12/27/2024)    Patient History     Smoking Tobacco Use: Never     Smokeless Tobacco Use: Never     Passive Exposure: Never   Transportation Needs: Not on file   Alcohol Use: Not on file   Housing: Not on file   Physical Activity: Not on file   Utilities: Not on file   Stress: Not on file   Interpersonal Safety: Not At Risk (12/12/2024)    Interpersonal Safety     Unsafe Where You Currently Live: No     Physically Hurt by Anyone: No     Abused by Anyone: No   Substance Use: Not on file (10/16/2023)   Intimate Partner Violence: Not on file   Social Connections: Not on file   Financial Resource Strain: Not on file   Health Literacy: Not on file   Internet Connectivity: Not on file       Would you be willing to receive help with any of the needs that you have identified today? Not applicable       Harlene DELENA Elder, PharmD  [facility] Specialty Pharmacist       [1]   Current Outpatient Medications   Medication Sig Dispense Refill    clonazePAM  (KLONOPIN ) 0.5 MG tablet Take 1 tablet (0.5 mg total) by mouth nightly as needed for anxiety. 30 tablet 3    dextroamphetamine-amphetamine (ADDERALL) 10 mg tablet Take 10 mg by mouth. (Patient taking differently: Take 1 tablet (10 mg total) by mouth as needed during procedure.)      diphenoxylate -atropine  (LOMOTIL ) 2.5-0.025 mg per tablet Take 2 tablets by mouth four (4) times a day as needed for diarrhea. 2 tabs PO QID prn 240 tablet 11    FLUoxetine  (PROZAC ) 40 MG capsule Take 1 capsule (40 mg total) by mouth daily. 90 capsule 3    melatonin 3 mg Tab Take 6 mg by mouth. (Patient taking differently: Take 2 tablets (6 mg total) by mouth. As needed)      tofacitinib  (XELJANZ ) 5 mg Tab tablet Take 1 tablet (5 mg  total) by mouth two (2) times a day. 180 tablet 0    ursodiol  (ACTIGALL ) 300 mg capsule Take 1 capsule (300 mg total) by mouth two (2) times a day. 60 capsule 11 vancomycin  (VANCOCIN ) 125 MG capsule Take 1 capsule (125 mg total) by mouth three (3) times a day. 90 capsule 0     No current facility-administered medications for this visit.   [2]   Allergies  Allergen Reactions    Oxycodone Itching   [3]   Patient Active Problem List  Diagnosis    Crohn's colitis (CMS-HCC)    Alopecia areata    Chronic rhinitis    Acute tonsillitis    Encounter for gynecological examination (general) (routine) without abnormal findings    Atypical squamous cells of undetermined significance on cytologic smear of cervix (ASC-US )    Iron  deficiency    Iron  deficiency anemia due to chronic blood loss

## 2025-01-05 ENCOUNTER — Inpatient Hospital Stay: Admit: 2025-01-05 | Discharge: 2025-01-05 | Payer: BLUE CROSS/BLUE SHIELD

## 2025-01-05 MED ADMIN — gadopiclenol (ELUCIREM,VUEWAY) injection 5.5 mL: 5.5 mL | INTRAVENOUS | @ 19:00:00 | Stop: 2025-01-05

## 2025-02-14 ENCOUNTER — Telehealth: Admitting: Psychiatry
